# Patient Record
Sex: Male | Born: 1950 | Race: White | Hispanic: No | Marital: Married | Smoking: Former smoker
Health system: Southern US, Community
[De-identification: ages and names within clinical notes are randomized; demographics above are authoritative.]

## PROBLEM LIST (undated history)

## (undated) DIAGNOSIS — K219 Gastro-esophageal reflux disease without esophagitis: Secondary | ICD-10-CM

## (undated) DIAGNOSIS — I10 Essential (primary) hypertension: Secondary | ICD-10-CM

## (undated) DIAGNOSIS — R519 Headache, unspecified: Secondary | ICD-10-CM

## (undated) DIAGNOSIS — Z87442 Personal history of urinary calculi: Secondary | ICD-10-CM

## (undated) DIAGNOSIS — F32A Depression, unspecified: Secondary | ICD-10-CM

## (undated) DIAGNOSIS — E785 Hyperlipidemia, unspecified: Secondary | ICD-10-CM

## (undated) DIAGNOSIS — R51 Headache: Secondary | ICD-10-CM

## (undated) DIAGNOSIS — M1990 Unspecified osteoarthritis, unspecified site: Secondary | ICD-10-CM

## (undated) DIAGNOSIS — F329 Major depressive disorder, single episode, unspecified: Secondary | ICD-10-CM

## (undated) DIAGNOSIS — M5126 Other intervertebral disc displacement, lumbar region: Secondary | ICD-10-CM

## (undated) DIAGNOSIS — K589 Irritable bowel syndrome without diarrhea: Secondary | ICD-10-CM

## (undated) DIAGNOSIS — G4700 Insomnia, unspecified: Secondary | ICD-10-CM

## (undated) DIAGNOSIS — M5416 Radiculopathy, lumbar region: Secondary | ICD-10-CM

## (undated) HISTORY — DX: Hyperlipidemia, unspecified: E78.5

## (undated) HISTORY — PX: BACK SURGERY: SHX140

## (undated) HISTORY — DX: Other intervertebral disc displacement, lumbar region: M51.26

## (undated) HISTORY — PX: COLONOSCOPY: SHX174

## (undated) HISTORY — PX: HIP SURGERY: SHX245

## (undated) HISTORY — DX: Radiculopathy, lumbar region: M54.16

## (undated) HISTORY — DX: Irritable bowel syndrome, unspecified: K58.9

## (undated) HISTORY — DX: Insomnia, unspecified: G47.00

## (undated) HISTORY — PX: PENILE PROSTHESIS IMPLANT: SHX240

## (undated) HISTORY — PX: CERVICAL SPINE SURGERY: SHX589

---

## 2010-08-31 HISTORY — PX: UMBILICAL HERNIA REPAIR: SHX196

## 2015-09-16 DIAGNOSIS — M47892 Other spondylosis, cervical region: Secondary | ICD-10-CM | POA: Diagnosis not present

## 2015-09-16 DIAGNOSIS — M542 Cervicalgia: Secondary | ICD-10-CM | POA: Diagnosis not present

## 2015-09-16 DIAGNOSIS — M50323 Other cervical disc degeneration at C6-C7 level: Secondary | ICD-10-CM | POA: Diagnosis not present

## 2015-09-16 DIAGNOSIS — Z1389 Encounter for screening for other disorder: Secondary | ICD-10-CM | POA: Diagnosis not present

## 2015-09-16 DIAGNOSIS — M50322 Other cervical disc degeneration at C5-C6 level: Secondary | ICD-10-CM | POA: Diagnosis not present

## 2015-09-16 DIAGNOSIS — Z6827 Body mass index (BMI) 27.0-27.9, adult: Secondary | ICD-10-CM | POA: Diagnosis not present

## 2015-09-20 DIAGNOSIS — M4722 Other spondylosis with radiculopathy, cervical region: Secondary | ICD-10-CM | POA: Diagnosis not present

## 2015-09-20 DIAGNOSIS — R51 Headache: Secondary | ICD-10-CM | POA: Diagnosis not present

## 2015-09-20 DIAGNOSIS — Z981 Arthrodesis status: Secondary | ICD-10-CM | POA: Diagnosis not present

## 2015-09-20 DIAGNOSIS — M4802 Spinal stenosis, cervical region: Secondary | ICD-10-CM | POA: Diagnosis not present

## 2015-09-20 DIAGNOSIS — M5011 Cervical disc disorder with radiculopathy,  high cervical region: Secondary | ICD-10-CM | POA: Diagnosis not present

## 2015-09-20 DIAGNOSIS — M5013 Cervical disc disorder with radiculopathy, cervicothoracic region: Secondary | ICD-10-CM | POA: Diagnosis not present

## 2015-10-15 DIAGNOSIS — M542 Cervicalgia: Secondary | ICD-10-CM | POA: Diagnosis not present

## 2015-10-15 DIAGNOSIS — M5412 Radiculopathy, cervical region: Secondary | ICD-10-CM | POA: Diagnosis not present

## 2015-10-15 DIAGNOSIS — M5020 Other cervical disc displacement, unspecified cervical region: Secondary | ICD-10-CM | POA: Diagnosis not present

## 2015-10-15 DIAGNOSIS — M4722 Other spondylosis with radiculopathy, cervical region: Secondary | ICD-10-CM | POA: Diagnosis not present

## 2015-10-15 DIAGNOSIS — M5030 Other cervical disc degeneration, unspecified cervical region: Secondary | ICD-10-CM | POA: Diagnosis not present

## 2015-10-16 DIAGNOSIS — R1314 Dysphagia, pharyngoesophageal phase: Secondary | ICD-10-CM | POA: Diagnosis not present

## 2015-10-16 DIAGNOSIS — R490 Dysphonia: Secondary | ICD-10-CM | POA: Diagnosis not present

## 2015-10-30 DIAGNOSIS — M5021 Other cervical disc displacement,  high cervical region: Secondary | ICD-10-CM | POA: Diagnosis not present

## 2015-10-30 DIAGNOSIS — M5022 Other cervical disc displacement, mid-cervical region, unspecified level: Secondary | ICD-10-CM | POA: Diagnosis not present

## 2015-10-30 DIAGNOSIS — M4722 Other spondylosis with radiculopathy, cervical region: Secondary | ICD-10-CM | POA: Diagnosis not present

## 2015-10-30 DIAGNOSIS — M5031 Other cervical disc degeneration,  high cervical region: Secondary | ICD-10-CM | POA: Diagnosis not present

## 2015-10-30 DIAGNOSIS — M47812 Spondylosis without myelopathy or radiculopathy, cervical region: Secondary | ICD-10-CM | POA: Diagnosis not present

## 2015-10-30 DIAGNOSIS — M4802 Spinal stenosis, cervical region: Secondary | ICD-10-CM | POA: Diagnosis not present

## 2015-11-22 DIAGNOSIS — M5020 Other cervical disc displacement, unspecified cervical region: Secondary | ICD-10-CM | POA: Diagnosis not present

## 2016-01-23 DIAGNOSIS — Z981 Arthrodesis status: Secondary | ICD-10-CM | POA: Diagnosis not present

## 2016-03-25 DIAGNOSIS — M5416 Radiculopathy, lumbar region: Secondary | ICD-10-CM | POA: Diagnosis not present

## 2016-03-25 DIAGNOSIS — M5481 Occipital neuralgia: Secondary | ICD-10-CM | POA: Diagnosis not present

## 2016-04-03 DIAGNOSIS — M5416 Radiculopathy, lumbar region: Secondary | ICD-10-CM | POA: Diagnosis not present

## 2016-04-03 DIAGNOSIS — M545 Low back pain: Secondary | ICD-10-CM | POA: Diagnosis not present

## 2016-04-15 DIAGNOSIS — M542 Cervicalgia: Secondary | ICD-10-CM | POA: Diagnosis not present

## 2016-04-15 DIAGNOSIS — M5481 Occipital neuralgia: Secondary | ICD-10-CM | POA: Diagnosis not present

## 2016-04-15 DIAGNOSIS — Z6826 Body mass index (BMI) 26.0-26.9, adult: Secondary | ICD-10-CM | POA: Diagnosis not present

## 2016-04-15 DIAGNOSIS — R030 Elevated blood-pressure reading, without diagnosis of hypertension: Secondary | ICD-10-CM | POA: Diagnosis not present

## 2016-04-21 DIAGNOSIS — M542 Cervicalgia: Secondary | ICD-10-CM | POA: Diagnosis not present

## 2016-05-12 DIAGNOSIS — Z9181 History of falling: Secondary | ICD-10-CM | POA: Diagnosis not present

## 2016-05-12 DIAGNOSIS — M5481 Occipital neuralgia: Secondary | ICD-10-CM | POA: Diagnosis not present

## 2016-05-29 DIAGNOSIS — M4722 Other spondylosis with radiculopathy, cervical region: Secondary | ICD-10-CM | POA: Diagnosis not present

## 2016-05-29 DIAGNOSIS — Z6826 Body mass index (BMI) 26.0-26.9, adult: Secondary | ICD-10-CM | POA: Diagnosis not present

## 2016-05-29 DIAGNOSIS — Z981 Arthrodesis status: Secondary | ICD-10-CM | POA: Diagnosis not present

## 2016-05-29 DIAGNOSIS — M5030 Other cervical disc degeneration, unspecified cervical region: Secondary | ICD-10-CM | POA: Diagnosis not present

## 2016-06-01 DIAGNOSIS — M4722 Other spondylosis with radiculopathy, cervical region: Secondary | ICD-10-CM | POA: Diagnosis not present

## 2016-06-01 DIAGNOSIS — M5030 Other cervical disc degeneration, unspecified cervical region: Secondary | ICD-10-CM | POA: Diagnosis not present

## 2016-06-01 DIAGNOSIS — M5481 Occipital neuralgia: Secondary | ICD-10-CM | POA: Diagnosis not present

## 2016-06-19 DIAGNOSIS — Z125 Encounter for screening for malignant neoplasm of prostate: Secondary | ICD-10-CM | POA: Diagnosis not present

## 2016-06-19 DIAGNOSIS — Z23 Encounter for immunization: Secondary | ICD-10-CM | POA: Diagnosis not present

## 2016-06-19 DIAGNOSIS — Z0000 Encounter for general adult medical examination without abnormal findings: Secondary | ICD-10-CM | POA: Diagnosis not present

## 2016-08-04 DIAGNOSIS — H524 Presbyopia: Secondary | ICD-10-CM | POA: Diagnosis not present

## 2016-08-04 DIAGNOSIS — H2513 Age-related nuclear cataract, bilateral: Secondary | ICD-10-CM | POA: Diagnosis not present

## 2016-08-06 DIAGNOSIS — L821 Other seborrheic keratosis: Secondary | ICD-10-CM | POA: Diagnosis not present

## 2016-08-06 DIAGNOSIS — L570 Actinic keratosis: Secondary | ICD-10-CM | POA: Diagnosis not present

## 2016-08-06 DIAGNOSIS — L578 Other skin changes due to chronic exposure to nonionizing radiation: Secondary | ICD-10-CM | POA: Diagnosis not present

## 2016-08-13 DIAGNOSIS — M5416 Radiculopathy, lumbar region: Secondary | ICD-10-CM | POA: Diagnosis not present

## 2016-09-09 DIAGNOSIS — G501 Atypical facial pain: Secondary | ICD-10-CM | POA: Diagnosis not present

## 2016-09-09 DIAGNOSIS — M5481 Occipital neuralgia: Secondary | ICD-10-CM | POA: Diagnosis not present

## 2016-09-22 DIAGNOSIS — G501 Atypical facial pain: Secondary | ICD-10-CM | POA: Diagnosis not present

## 2016-09-24 DIAGNOSIS — G501 Atypical facial pain: Secondary | ICD-10-CM | POA: Diagnosis not present

## 2016-09-24 DIAGNOSIS — R51 Headache: Secondary | ICD-10-CM | POA: Diagnosis not present

## 2016-10-15 DIAGNOSIS — E663 Overweight: Secondary | ICD-10-CM | POA: Diagnosis not present

## 2016-10-15 DIAGNOSIS — Z6827 Body mass index (BMI) 27.0-27.9, adult: Secondary | ICD-10-CM | POA: Diagnosis not present

## 2016-11-18 DIAGNOSIS — L820 Inflamed seborrheic keratosis: Secondary | ICD-10-CM | POA: Diagnosis not present

## 2016-11-18 DIAGNOSIS — L720 Epidermal cyst: Secondary | ICD-10-CM | POA: Diagnosis not present

## 2016-11-18 DIAGNOSIS — L578 Other skin changes due to chronic exposure to nonionizing radiation: Secondary | ICD-10-CM | POA: Diagnosis not present

## 2016-12-08 DIAGNOSIS — G501 Atypical facial pain: Secondary | ICD-10-CM | POA: Diagnosis not present

## 2016-12-08 DIAGNOSIS — M5481 Occipital neuralgia: Secondary | ICD-10-CM | POA: Diagnosis not present

## 2016-12-08 DIAGNOSIS — M961 Postlaminectomy syndrome, not elsewhere classified: Secondary | ICD-10-CM | POA: Diagnosis not present

## 2016-12-16 DIAGNOSIS — M21372 Foot drop, left foot: Secondary | ICD-10-CM | POA: Diagnosis not present

## 2017-01-05 DIAGNOSIS — I10 Essential (primary) hypertension: Secondary | ICD-10-CM | POA: Diagnosis not present

## 2017-01-05 DIAGNOSIS — M961 Postlaminectomy syndrome, not elsewhere classified: Secondary | ICD-10-CM | POA: Diagnosis not present

## 2017-01-05 DIAGNOSIS — G501 Atypical facial pain: Secondary | ICD-10-CM | POA: Diagnosis not present

## 2017-01-05 DIAGNOSIS — M5481 Occipital neuralgia: Secondary | ICD-10-CM | POA: Diagnosis not present

## 2017-04-05 DIAGNOSIS — N132 Hydronephrosis with renal and ureteral calculous obstruction: Secondary | ICD-10-CM | POA: Diagnosis not present

## 2017-04-05 DIAGNOSIS — R109 Unspecified abdominal pain: Secondary | ICD-10-CM | POA: Diagnosis not present

## 2017-04-05 DIAGNOSIS — Z6827 Body mass index (BMI) 27.0-27.9, adult: Secondary | ICD-10-CM | POA: Diagnosis not present

## 2017-04-05 DIAGNOSIS — Z139 Encounter for screening, unspecified: Secondary | ICD-10-CM | POA: Diagnosis not present

## 2017-05-04 DIAGNOSIS — F4542 Pain disorder with related psychological factors: Secondary | ICD-10-CM | POA: Diagnosis not present

## 2017-05-04 DIAGNOSIS — M961 Postlaminectomy syndrome, not elsewhere classified: Secondary | ICD-10-CM | POA: Diagnosis not present

## 2017-05-13 DIAGNOSIS — R918 Other nonspecific abnormal finding of lung field: Secondary | ICD-10-CM | POA: Diagnosis not present

## 2017-05-13 DIAGNOSIS — R05 Cough: Secondary | ICD-10-CM | POA: Diagnosis not present

## 2017-05-13 DIAGNOSIS — R079 Chest pain, unspecified: Secondary | ICD-10-CM | POA: Diagnosis not present

## 2017-05-13 DIAGNOSIS — M25511 Pain in right shoulder: Secondary | ICD-10-CM | POA: Diagnosis not present

## 2017-06-01 DIAGNOSIS — M1711 Unilateral primary osteoarthritis, right knee: Secondary | ICD-10-CM | POA: Diagnosis not present

## 2017-06-08 DIAGNOSIS — K219 Gastro-esophageal reflux disease without esophagitis: Secondary | ICD-10-CM | POA: Diagnosis not present

## 2017-06-08 DIAGNOSIS — K5900 Constipation, unspecified: Secondary | ICD-10-CM | POA: Diagnosis not present

## 2017-06-08 DIAGNOSIS — K2270 Barrett's esophagus without dysplasia: Secondary | ICD-10-CM | POA: Diagnosis not present

## 2017-06-16 DIAGNOSIS — K219 Gastro-esophageal reflux disease without esophagitis: Secondary | ICD-10-CM | POA: Diagnosis not present

## 2017-06-16 DIAGNOSIS — D175 Benign lipomatous neoplasm of intra-abdominal organs: Secondary | ICD-10-CM | POA: Diagnosis not present

## 2017-06-16 DIAGNOSIS — K2270 Barrett's esophagus without dysplasia: Secondary | ICD-10-CM | POA: Diagnosis not present

## 2017-06-16 DIAGNOSIS — D125 Benign neoplasm of sigmoid colon: Secondary | ICD-10-CM | POA: Diagnosis not present

## 2017-07-05 DIAGNOSIS — G894 Chronic pain syndrome: Secondary | ICD-10-CM | POA: Diagnosis not present

## 2017-07-05 DIAGNOSIS — M961 Postlaminectomy syndrome, not elsewhere classified: Secondary | ICD-10-CM | POA: Diagnosis not present

## 2017-07-12 DIAGNOSIS — M961 Postlaminectomy syndrome, not elsewhere classified: Secondary | ICD-10-CM | POA: Diagnosis not present

## 2017-07-12 DIAGNOSIS — G894 Chronic pain syndrome: Secondary | ICD-10-CM | POA: Diagnosis not present

## 2017-07-12 DIAGNOSIS — R030 Elevated blood-pressure reading, without diagnosis of hypertension: Secondary | ICD-10-CM | POA: Diagnosis not present

## 2017-07-12 DIAGNOSIS — Z6827 Body mass index (BMI) 27.0-27.9, adult: Secondary | ICD-10-CM | POA: Diagnosis not present

## 2017-07-13 ENCOUNTER — Other Ambulatory Visit: Payer: Self-pay | Admitting: Anesthesiology

## 2017-07-27 NOTE — Pre-Procedure Instructions (Signed)
Parker Lawrence  07/27/2017      [place], [place] [address] Phone: [phone] Fax: [phone]    Your procedure is scheduled on Friday November 30.  Report to [facility] Admitting at 5:30 A.M.  Call this number if you have problems the morning of surgery:  [phone]   Remember:  Do not eat food or drink liquids after midnight.  Take these medicines the morning of surgery with A SIP OF WATER:   Acetaminophen (tylenol) if needed dexlansoprazole (Dexilant) Gabapentin (neurontin) Venlafaxine (effexor)  7 days prior to surgery STOP taking any Aleve, Naproxen, Ibuprofen, Motrin, Advil, Goody's, BC's, all herbal medications, fish oil, and all vitamins  Follow MD's instructions on stopping Aspirin. If no instructions have been given please call your surgeon for instruction.     Do not wear jewelry, make-up or nail polish.  Do not wear lotions, powders, or perfumes, or deoderant.  Do not shave 48 hours prior to surgery.  Men may shave face and neck.  Do not bring valuables to the hospital.  [facility] is not responsible for any belongings or valuables.  Contacts, dentures or bridgework may not be worn into surgery.  Leave your suitcase in the car.  After surgery it may be brought to your room.  For patients admitted to the hospital, discharge time will be determined by your treatment team.  Patients discharged the day of surgery will not be allowed to drive home.   Special instructions:    [facility]- Preparing For Surgery  Before surgery, you can play an important role. Because skin is not sterile, your skin needs to be as free of germs as possible. You can reduce the number of germs on your skin by washing with CHG (chlorahexidine gluconate) Soap before surgery.  CHG is an antiseptic cleaner which kills germs and bonds with the skin to continue killing germs even after washing.  Please do not  use if you have an allergy to CHG or antibacterial soaps. If your skin becomes reddened/irritated stop using the CHG.  Do not shave (including legs and underarms) for at least 48 hours prior to first CHG shower. It is OK to shave your face.  Please follow these instructions carefully.   1. Shower the NIGHT BEFORE SURGERY and the MORNING OF SURGERY with CHG.   2. If you chose to wash your hair, wash your hair first as usual with your normal shampoo.  3. After you shampoo, rinse your hair and body thoroughly to remove the shampoo.  4. Use CHG as you would any other liquid soap. You can apply CHG directly to the skin and wash gently with a scrungie or a clean washcloth.   5. Apply the CHG Soap to your body ONLY FROM THE NECK DOWN.  Do not use on open wounds or open sores. Avoid contact with your eyes, ears, mouth and genitals (private parts). Wash Face and genitals (private parts)  with your normal soap.  6. Wash thoroughly, paying special attention to the area where your surgery will be performed.  7. Thoroughly rinse your body with warm water from the neck down.  8. DO NOT shower/wash with your normal soap after using and rinsing off the CHG Soap.  9. Pat yourself dry with a CLEAN TOWEL.  10. Wear CLEAN PAJAMAS to bed the night before surgery, wear comfortable clothes the morning of surgery  11. Place CLEAN  SHEETS on your bed the night of your first shower and DO NOT SLEEP WITH PETS.    Day of Surgery: Do not apply any deodorants/lotions. Please wear clean clothes to the hospital/surgery center.      Please read over the following fact sheets that you were given. Coughing and Deep Breathing and Surgical Site Infection Prevention

## 2017-07-28 ENCOUNTER — Encounter (HOSPITAL_COMMUNITY): Payer: Self-pay

## 2017-07-28 ENCOUNTER — Encounter (HOSPITAL_COMMUNITY)
Admission: RE | Admit: 2017-07-28 | Discharge: 2017-07-28 | Disposition: A | Discharge status: Home / Self Care | Payer: PPO | Source: Ambulatory Visit | Attending: Anesthesiology | Admitting: Anesthesiology

## 2017-07-28 ENCOUNTER — Other Ambulatory Visit: Payer: Self-pay

## 2017-07-28 DIAGNOSIS — M5416 Radiculopathy, lumbar region: Secondary | ICD-10-CM | POA: Diagnosis not present

## 2017-07-28 DIAGNOSIS — M545 Low back pain: Secondary | ICD-10-CM | POA: Diagnosis not present

## 2017-07-28 DIAGNOSIS — M961 Postlaminectomy syndrome, not elsewhere classified: Secondary | ICD-10-CM | POA: Diagnosis not present

## 2017-07-28 DIAGNOSIS — K219 Gastro-esophageal reflux disease without esophagitis: Secondary | ICD-10-CM | POA: Diagnosis not present

## 2017-07-28 DIAGNOSIS — Z7982 Long term (current) use of aspirin: Secondary | ICD-10-CM | POA: Diagnosis not present

## 2017-07-28 DIAGNOSIS — G8929 Other chronic pain: Secondary | ICD-10-CM | POA: Diagnosis not present

## 2017-07-28 DIAGNOSIS — Y838 Other surgical procedures as the cause of abnormal reaction of the patient, or of later complication, without mention of misadventure at the time of the procedure: Secondary | ICD-10-CM | POA: Diagnosis not present

## 2017-07-28 DIAGNOSIS — F329 Major depressive disorder, single episode, unspecified: Secondary | ICD-10-CM | POA: Diagnosis not present

## 2017-07-28 DIAGNOSIS — Z79899 Other long term (current) drug therapy: Secondary | ICD-10-CM | POA: Diagnosis not present

## 2017-07-28 HISTORY — DX: Headache, unspecified: R51.9

## 2017-07-28 HISTORY — DX: Unspecified osteoarthritis, unspecified site: M19.90

## 2017-07-28 HISTORY — DX: Major depressive disorder, single episode, unspecified: F32.9

## 2017-07-28 HISTORY — DX: Headache: R51

## 2017-07-28 HISTORY — DX: Gastro-esophageal reflux disease without esophagitis: K21.9

## 2017-07-28 HISTORY — DX: Depression, unspecified: F32.A

## 2017-07-28 LAB — BASIC METABOLIC PANEL
Anion gap: 6 (ref 5–15)
BUN: 7 mg/dL (ref 6–20)
CHLORIDE: 107 mmol/L (ref 101–111)
CO2: 26 mmol/L (ref 22–32)
CREATININE: 0.93 mg/dL (ref 0.61–1.24)
Calcium: 9.1 mg/dL (ref 8.9–10.3)
GFR calc Af Amer: >60 mL/min (ref >60)
GFR calc non Af Amer: >60 mL/min (ref >60)
Glucose, Bld: 119 mg/dL — ABNORMAL HIGH (ref 65–99)
POTASSIUM: 4.2 mmol/L (ref 3.5–5.1)
Sodium: 139 mmol/L (ref 135–145)

## 2017-07-28 LAB — CBC
HEMATOCRIT: 44.5 % (ref 39.0–52.0)
HEMOGLOBIN: 15.1 g/dL (ref 13.0–17.0)
MCH: 30 pg (ref 26.0–34.0)
MCHC: 33.9 g/dL (ref 30.0–36.0)
MCV: 88.3 fL (ref 78.0–100.0)
Platelets: 236 10*3/uL (ref 150–400)
RBC: 5.04 MIL/uL (ref 4.22–5.81)
RDW: 13 % (ref 11.5–15.5)
WBC: 5.2 10*3/uL (ref 4.0–10.5)

## 2017-07-28 LAB — PROTIME-INR
INR: 0.94
Prothrombin Time: 12.5 seconds (ref 11.4–15.2)

## 2017-07-28 LAB — SURGICAL PCR SCREEN
MRSA, PCR: NEGATIVE
Staphylococcus aureus: NEGATIVE

## 2017-07-28 LAB — APTT: aPTT: 31 seconds (ref 24–36)

## 2017-07-28 MED ORDER — CHLORHEXIDINE GLUCONATE CLOTH 2 % EX PADS: 6.0000 | MEDICATED_PAD | Freq: Once | CUTANEOUS | Status: DC

## 2017-07-28 NOTE — Progress Notes (Signed)
PCP - Amy Moon  Pt was instructed by surgeon to stop taking aspirin and fish oil 7 days prior to surgery. Pt is no longer taking these.   Pt denies having cardiologist or cardiac history. No cardiac workup.   Patient denies shortness of breath, fever, cough and chest pain at PAT appointment  Patient verbalized understanding of instructions that were given to them at the PAT appointment. Patient was also instructed that they will need to review over the PAT instructions again at home before surgery.
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
## 2017-07-29 NOTE — Anesthesia Preprocedure Evaluation (Addendum)
Anesthesia Evaluation  Patient identified by MRN, date of birth, ID band Patient awake    Reviewed: Allergy & Precautions, NPO status , Patient's Chart, lab work & pertinent test results, reviewed documented beta blocker date and time   History of Anesthesia Complications Negative for: history of anesthetic complications  Airway Mallampati: I  TM Distance: >3 FB Neck ROM: Full    Dental  (+) Dental Advisory Given, Edentulous Upper, Edentulous Lower   Pulmonary neg pulmonary ROS,    Pulmonary exam normal        Cardiovascular negative cardio ROS Normal cardiovascular exam     Neuro/Psych PSYCHIATRIC DISORDERS Depression    GI/Hepatic Neg liver ROS, GERD  ,  Endo/Other  negative endocrine ROS  Renal/GU negative Renal ROS     Musculoskeletal negative musculoskeletal ROS (+)   Abdominal   Peds  Hematology negative hematology ROS (+)   Anesthesia Other Findings Day of surgery medications reviewed with the patient.  Reproductive/Obstetrics                           Anesthesia Physical Anesthesia Plan  ASA: II  Anesthesia Plan: MAC   Post-op Pain Management:    Induction:   PONV Risk Score and Plan: 2 and Ondansetron and Dexamethasone  Airway Management Planned: Natural Airway and Simple Face Mask  Additional Equipment:   Intra-op Plan:   Post-operative Plan:   Informed Consent: I have reviewed the patients History and Physical, chart, labs and discussed the procedure including the risks, benefits and alternatives for the proposed anesthesia with the patient or authorized representative who has indicated his/her understanding and acceptance.   Dental advisory given  Plan Discussed with: CRNA, Anesthesiologist and Surgeon  Anesthesia Plan Comments:        Anesthesia Quick Evaluation

## 2017-07-30 ENCOUNTER — Ambulatory Visit (HOSPITAL_COMMUNITY): Payer: PPO

## 2017-07-30 ENCOUNTER — Ambulatory Visit (HOSPITAL_COMMUNITY): Payer: PPO | Admitting: Certified Registered"

## 2017-07-30 ENCOUNTER — Ambulatory Visit (HOSPITAL_COMMUNITY)
Admission: RE | Admit: 2017-07-30 | Discharge: 2017-07-30 | Disposition: A | Discharge status: Home / Self Care | Payer: PPO | Source: Ambulatory Visit | Attending: Anesthesiology | Admitting: Anesthesiology

## 2017-07-30 ENCOUNTER — Encounter (HOSPITAL_COMMUNITY): Payer: Self-pay | Admitting: Certified Registered"

## 2017-07-30 ENCOUNTER — Encounter (HOSPITAL_COMMUNITY)
Admission: RE | Disposition: A | Discharge status: Home / Self Care | Payer: Self-pay | Source: Ambulatory Visit | Attending: Anesthesiology

## 2017-07-30 DIAGNOSIS — M5116 Intervertebral disc disorders with radiculopathy, lumbar region: Secondary | ICD-10-CM | POA: Diagnosis not present

## 2017-07-30 DIAGNOSIS — M961 Postlaminectomy syndrome, not elsewhere classified: Secondary | ICD-10-CM | POA: Diagnosis not present

## 2017-07-30 DIAGNOSIS — M545 Low back pain: Secondary | ICD-10-CM | POA: Insufficient documentation

## 2017-07-30 DIAGNOSIS — F329 Major depressive disorder, single episode, unspecified: Secondary | ICD-10-CM | POA: Insufficient documentation

## 2017-07-30 DIAGNOSIS — Z7982 Long term (current) use of aspirin: Secondary | ICD-10-CM | POA: Diagnosis not present

## 2017-07-30 DIAGNOSIS — Z79899 Other long term (current) drug therapy: Secondary | ICD-10-CM | POA: Diagnosis not present

## 2017-07-30 DIAGNOSIS — Z462 Encounter for fitting and adjustment of other devices related to nervous system and special senses: Secondary | ICD-10-CM | POA: Diagnosis not present

## 2017-07-30 DIAGNOSIS — M5416 Radiculopathy, lumbar region: Secondary | ICD-10-CM | POA: Insufficient documentation

## 2017-07-30 DIAGNOSIS — K219 Gastro-esophageal reflux disease without esophagitis: Secondary | ICD-10-CM | POA: Insufficient documentation

## 2017-07-30 DIAGNOSIS — Y838 Other surgical procedures as the cause of abnormal reaction of the patient, or of later complication, without mention of misadventure at the time of the procedure: Secondary | ICD-10-CM | POA: Insufficient documentation

## 2017-07-30 DIAGNOSIS — G894 Chronic pain syndrome: Secondary | ICD-10-CM | POA: Diagnosis not present

## 2017-07-30 DIAGNOSIS — G8929 Other chronic pain: Secondary | ICD-10-CM | POA: Diagnosis not present

## 2017-07-30 HISTORY — PX: SPINAL CORD STIMULATOR INSERTION: SHX5378

## 2017-07-30 SURGERY — INSERTION, SPINAL CORD STIMULATOR, LUMBAR
Anesthesia: Monitor Anesthesia Care

## 2017-07-30 MED ORDER — HYDROCODONE-ACETAMINOPHEN 10-325 MG PO TABS: 1.0000 | ORAL_TABLET | Freq: Four times a day (QID) | ORAL | 0 refills | Status: DC | PRN

## 2017-07-30 MED ORDER — LIDOCAINE 2% (20 MG/ML) 5 ML SYRINGE
INTRAMUSCULAR | Status: DC | PRN
  Administered 2017-07-30: 50 mg via INTRAVENOUS

## 2017-07-30 MED ORDER — BUPIVACAINE-EPINEPHRINE (PF) 0.5% -1:200000 IJ SOLN
INTRAMUSCULAR | Status: DC | PRN
  Administered 2017-07-30: 18 mL

## 2017-07-30 MED ORDER — HYDROMORPHONE HCL 1 MG/ML IJ SOLN
INTRAMUSCULAR | Status: AC
  Filled 2017-07-30: qty 1

## 2017-07-30 MED ORDER — HYDROMORPHONE HCL 1 MG/ML IJ SOLN
INTRAMUSCULAR | Status: AC
  Administered 2017-07-30: 0.5 mg via INTRAVENOUS
  Filled 2017-07-30: qty 1

## 2017-07-30 MED ORDER — ONDANSETRON HCL 4 MG/2ML IJ SOLN
INTRAMUSCULAR | Status: DC | PRN
  Administered 2017-07-30: 4 mg via INTRAVENOUS

## 2017-07-30 MED ORDER — ONDANSETRON HCL 4 MG/2ML IJ SOLN
INTRAMUSCULAR | Status: AC
  Filled 2017-07-30: qty 2

## 2017-07-30 MED ORDER — FENTANYL CITRATE (PF) 100 MCG/2ML IJ SOLN
INTRAMUSCULAR | Status: DC | PRN
  Administered 2017-07-30 (×2): 50 ug via INTRAVENOUS

## 2017-07-30 MED ORDER — DOUBLE ANTIBIOTIC 500-10000 UNIT/GM EX OINT
TOPICAL_OINTMENT | CUTANEOUS | Status: AC
  Filled 2017-07-30: qty 1

## 2017-07-30 MED ORDER — LACTATED RINGERS IV SOLN
INTRAVENOUS | Status: DC | PRN
  Administered 2017-07-30: 07:00:00 via INTRAVENOUS

## 2017-07-30 MED ORDER — 0.9 % SODIUM CHLORIDE (POUR BTL) OPTIME
TOPICAL | Status: DC | PRN
  Administered 2017-07-30: 1000 mL

## 2017-07-30 MED ORDER — HYDROMORPHONE HCL 1 MG/ML IJ SOLN
0.2500 mg | INTRAMUSCULAR | Status: DC | PRN
  Administered 2017-07-30 (×4): 0.5 mg via INTRAVENOUS

## 2017-07-30 MED ORDER — PROPOFOL 500 MG/50ML IV EMUL
INTRAVENOUS | Status: DC | PRN
  Administered 2017-07-30: 60 ug/kg/min via INTRAVENOUS

## 2017-07-30 MED ORDER — MIDAZOLAM HCL 2 MG/2ML IJ SOLN
INTRAMUSCULAR | Status: AC
  Filled 2017-07-30: qty 2

## 2017-07-30 MED ORDER — BUPIVACAINE HCL (PF) 0.5 % IJ SOLN
INTRAMUSCULAR | Status: AC
  Filled 2017-07-30: qty 30

## 2017-07-30 MED ORDER — BUPIVACAINE-EPINEPHRINE (PF) 0.5% -1:200000 IJ SOLN
INTRAMUSCULAR | Status: AC
  Filled 2017-07-30: qty 30

## 2017-07-30 MED ORDER — MIDAZOLAM HCL 5 MG/5ML IJ SOLN
INTRAMUSCULAR | Status: DC | PRN
  Administered 2017-07-30: 2 mg via INTRAVENOUS

## 2017-07-30 MED ORDER — CEFAZOLIN SODIUM-DEXTROSE 2-4 GM/100ML-% IV SOLN
2.0000 g | INTRAVENOUS | Status: AC
  Administered 2017-07-30: 2 g via INTRAVENOUS
  Filled 2017-07-30: qty 100

## 2017-07-30 MED ORDER — PROPOFOL 10 MG/ML IV BOLUS
INTRAVENOUS | Status: DC | PRN
  Administered 2017-07-30: 30 mg via INTRAVENOUS
  Administered 2017-07-30 (×2): 20 mg via INTRAVENOUS

## 2017-07-30 MED ORDER — DEXAMETHASONE SODIUM PHOSPHATE 10 MG/ML IJ SOLN
INTRAMUSCULAR | Status: AC
  Filled 2017-07-30: qty 1

## 2017-07-30 MED ORDER — DEXAMETHASONE SODIUM PHOSPHATE 4 MG/ML IJ SOLN
INTRAMUSCULAR | Status: DC | PRN
  Administered 2017-07-30: 8 mg via INTRAVENOUS

## 2017-07-30 MED ORDER — LIDOCAINE 2% (20 MG/ML) 5 ML SYRINGE
INTRAMUSCULAR | Status: AC
  Filled 2017-07-30: qty 5

## 2017-07-30 MED ORDER — PROMETHAZINE HCL 25 MG/ML IJ SOLN: 6.2500 mg | INTRAMUSCULAR | Status: DC | PRN

## 2017-07-30 MED ORDER — FENTANYL CITRATE (PF) 250 MCG/5ML IJ SOLN
INTRAMUSCULAR | Status: AC
  Filled 2017-07-30: qty 5

## 2017-07-30 MED ORDER — CLINDAMYCIN HCL 150 MG PO CAPS: 150.0000 mg | ORAL_CAPSULE | Freq: Three times a day (TID) | ORAL | 0 refills | Status: AC

## 2017-07-30 MED ORDER — SODIUM CHLORIDE 0.9 % IR SOLN
Status: DC | PRN
  Administered 2017-07-30: 08:00:00

## 2017-07-30 SURGICAL SUPPLY — 67 items
ADH SKN CLS APL DERMABOND .7 (GAUZE/BANDAGES/DRESSINGS) ×1
ANCH LD 4 SETX2 CLIK X (Stimulator) ×1 IMPLANT
ANCHOR CLIK X NEURO (Stimulator) ×1 IMPLANT
APL SKNCLS STERI-STRIP NONHPOA (GAUZE/BANDAGES/DRESSINGS)
BAG DECANTER FOR FLEXI CONT (MISCELLANEOUS) ×2 IMPLANT
BENZOIN TINCTURE PRP APPL 2/3 (GAUZE/BANDAGES/DRESSINGS) IMPLANT
BINDER ABDOMINAL 12 ML 46-62 (SOFTGOODS) ×2 IMPLANT
BLADE CLIPPER SURG (BLADE) IMPLANT
CABLE OR STIMULATOR 2X8 61 (WIRE) ×2 IMPLANT
CHLORAPREP W/TINT 26ML (MISCELLANEOUS) ×2 IMPLANT
CLIP VESOCCLUDE SM WIDE 6/CT (CLIP) IMPLANT
DERMABOND ADVANCED (GAUZE/BANDAGES/DRESSINGS) ×1
DERMABOND ADVANCED .7 DNX12 (GAUZE/BANDAGES/DRESSINGS) ×1 IMPLANT
DRAPE C-ARM 42X72 X-RAY (DRAPES) ×2 IMPLANT
DRAPE C-ARMOR (DRAPES) ×2 IMPLANT
DRAPE LAPAROTOMY 100X72X124 (DRAPES) ×2 IMPLANT
DRAPE POUCH INSTRU U-SHP 10X18 (DRAPES) ×2 IMPLANT
DRAPE SURG 17X23 STRL (DRAPES) ×2 IMPLANT
DRSG OPSITE POSTOP 3X4 (GAUZE/BANDAGES/DRESSINGS) ×1 IMPLANT
DRSG OPSITE POSTOP 4X6 (GAUZE/BANDAGES/DRESSINGS) ×1 IMPLANT
ELECT REM PT RETURN 9FT ADLT (ELECTROSURGICAL) ×2
ELECTRODE REM PT RTRN 9FT ADLT (ELECTROSURGICAL) ×1 IMPLANT
GAUZE SPONGE 4X4 16PLY XRAY LF (GAUZE/BANDAGES/DRESSINGS) ×3 IMPLANT
GENERATOR NEUROSTIMULATOR (Neurostimulator) ×1 IMPLANT
GLOVE BIOGEL PI IND STRL 7.5 (GLOVE) ×1 IMPLANT
GLOVE BIOGEL PI INDICATOR 7.5 (GLOVE) ×1
GLOVE ECLIPSE 7.5 STRL STRAW (GLOVE) ×2 IMPLANT
GLOVE EXAM NITRILE LRG STRL (GLOVE) IMPLANT
GLOVE EXAM NITRILE XL STR (GLOVE) IMPLANT
GLOVE EXAM NITRILE XS STR PU (GLOVE) IMPLANT
GOWN STRL REUS W/ TWL LRG LVL3 (GOWN DISPOSABLE) IMPLANT
GOWN STRL REUS W/ TWL XL LVL3 (GOWN DISPOSABLE) IMPLANT
GOWN STRL REUS W/TWL 2XL LVL3 (GOWN DISPOSABLE) IMPLANT
GOWN STRL REUS W/TWL LRG LVL3 (GOWN DISPOSABLE)
GOWN STRL REUS W/TWL XL LVL3 (GOWN DISPOSABLE)
KIT BASIN OR (CUSTOM PROCEDURE TRAY) ×2 IMPLANT
KIT CHARGING (KITS) ×1
KIT CHARGING PRECISION NEURO (KITS) IMPLANT
KIT REMOTE CONTROL 112802 FREE (KITS) ×1 IMPLANT
KIT ROOM TURNOVER OR (KITS) ×2 IMPLANT
LEAD INFINION CX PERC 70CM (Lead) ×2 IMPLANT
NDL 18GX1X1/2 (RX/OR ONLY) (NEEDLE) IMPLANT
NDL ENTRADA 4.5IN (NEEDLE) IMPLANT
NDL HYPO 25X1 1.5 SAFETY (NEEDLE) ×1 IMPLANT
NEEDLE 18GX1X1/2 (RX/OR ONLY) (NEEDLE) IMPLANT
NEEDLE ENTRADA 4.5IN (NEEDLE) ×4 IMPLANT
NEEDLE HYPO 25X1 1.5 SAFETY (NEEDLE) ×2 IMPLANT
NS IRRIG 1000ML POUR BTL (IV SOLUTION) ×2 IMPLANT
PACK LAMINECTOMY NEURO (CUSTOM PROCEDURE TRAY) ×2 IMPLANT
PAD ARMBOARD 7.5X6 YLW CONV (MISCELLANEOUS) ×2 IMPLANT
SPONGE LAP 4X18 X RAY DECT (DISPOSABLE) ×2 IMPLANT
SPONGE SURGIFOAM ABS GEL SZ50 (HEMOSTASIS) IMPLANT
STAPLER SKIN PROX WIDE 3.9 (STAPLE) ×2 IMPLANT
STRIP CLOSURE SKIN 1/2X4 (GAUZE/BANDAGES/DRESSINGS) IMPLANT
SUT MNCRL AB 4-0 PS2 18 (SUTURE) IMPLANT
SUT SILK 0 (SUTURE) ×2
SUT SILK 0 MO-6 18XCR BRD 8 (SUTURE) ×1 IMPLANT
SUT SILK 0 TIES 10X30 (SUTURE) IMPLANT
SUT SILK 2 0 TIES 10X30 (SUTURE) IMPLANT
SUT VIC AB 2-0 CP2 18 (SUTURE) ×4 IMPLANT
SYR 10ML LL (SYRINGE) IMPLANT
SYR EPIDURAL 5ML GLASS (SYRINGE) ×2 IMPLANT
TOOL LONG TUNNEL (SPINAL CORD STIMULATOR) ×1 IMPLANT
TOWEL GREEN STERILE (TOWEL DISPOSABLE) ×2 IMPLANT
TOWEL GREEN STERILE FF (TOWEL DISPOSABLE) ×2 IMPLANT
WATER STERILE IRR 1000ML POUR (IV SOLUTION) ×2 IMPLANT
YANKAUER SUCT BULB TIP NO VENT (SUCTIONS) ×2 IMPLANT

## 2017-07-30 NOTE — Anesthesia Postprocedure Evaluation (Signed)
Anesthesia Post Note  Patient: Parker Lawrence  Procedure(s) Performed: LUMBAR SPINAL CORD STIMULATOR INSERTION (N/A )     Patient location during evaluation: PACU Anesthesia Type: MAC Level of consciousness: awake and alert Pain management: pain level controlled Vital Signs Assessment: post-procedure vital signs reviewed and stable Respiratory status: spontaneous breathing and respiratory function stable Cardiovascular status: stable Postop Assessment: no apparent nausea or vomiting Anesthetic complications: no    Last Vitals:  Vitals:   07/30/17 1015 07/30/17 1030  BP: 132/86 127/71  Pulse: 62 76  Resp: 13 16  Temp:  36.4 C  SpO2: 93% 93%    Last Pain:  Vitals:   07/30/17 1000  TempSrc:   PainSc: 3                  [place] DANIEL

## 2017-07-30 NOTE — Discharge Instructions (Addendum)

## 2017-07-30 NOTE — Transfer of Care (Signed)
Immediate Anesthesia Transfer of Care Note  Patient: Parker Lawrence  Procedure(s) Performed: LUMBAR SPINAL CORD STIMULATOR INSERTION (N/A )  Patient Location: PACU  Anesthesia Type:MAC  Level of Consciousness: awake, alert  and oriented  Airway & Oxygen Therapy: Patient Spontanous Breathing and Patient connected to nasal cannula oxygen  Post-op Assessment: Report given to RN and Post -op Vital signs reviewed and stable  Post vital signs: Reviewed and stable  Last Vitals:  Vitals:   07/30/17 0616  BP: (!) 162/80  Pulse: 65  Resp: 18  Temp: 36.6 C  SpO2: 96%    Last Pain:  Vitals:   07/30/17 0616  TempSrc: Oral         Complications: No apparent anesthesia complications

## 2017-07-30 NOTE — Anesthesia Procedure Notes (Signed)
Procedure Name: MAC Date/Time: 07/30/2017 7:33 AM Performed by: Orlie Dakin, CRNA Pre-anesthesia Checklist: Patient identified, Emergency Drugs available, Suction available, Patient being monitored and Timeout performed Patient Re-evaluated:Patient Re-evaluated prior to induction Oxygen Delivery Method: Nasal cannula

## 2017-07-30 NOTE — Op Note (Signed)
PREOP DX: 1) lumbago  2) lumbar radiculopathy  3) lumbar post-laminectomy syndrome  4) chronic pain  POSTOP DX: 1) lumbago  2) lumbar radiculopathy  3) lumbar post-laminectomy syndrome  4) chronic pain PROCEDURES PERFORMED:1) intraop fluoro 2) placement of 2 16 contact [employer] Infinion leads 3) placement of Spectra SCS generator  SURGEON:Arijana Narayan  ASSISTANT: NONE  ANESTHESIA:MAC/TIVA EBL: <20cc  DESCRIPTION OF PROCEDURE: After a discussion of risks, benefits and alternatives, informed consent was obtained. The patient was taken to the OR,general anesthesia induced by the anesthesia team,turned prone onto a [place] table, all pressure points padded, SCD's placed, and an adequate plane of anesthesia induced. A timeout was taken to verify the correct patient, position, personnel, availability of appropriate equipment, and administration of perioperative antibiotics.   The thoracic and lumbar areas were widely prepped with chloraprep and draped into a sterile field. Fluoroscopy was used to plan arightparamedian incision at theL1-L2 levels, and an incision made with a 10 blade and carried down to the dorsolumbar fascia with the bovie and blunt dissection. Retractors were placed and a 14g [employer] tuohy needle placed into the epidural space at the T12-L1 interspace using biplanar fluoro and loss-of-resistance technique. The needle was aspirated without any return of fluid. A [employer] INFINION lead was introduced and under live AP fluoro advanced until the2distal-most contactsoverlay the inferior aspect T7vertebral body shadow with the rest of the contacts distributed over the T8and T9 vertebral bodies in a position just at anatomic midline. A second Infinion lead was placed left of anatomic midline in the same levels using the same  technique. The patient was awakened, and coverage tested. Patient reported good coverage in all of his pain areas.   Adequate sedation was re-established, and 0 silk sutures were placed in the fascia adjacent to the needles. The needles and stylets were removed under fluoroscopy with no lead migration noted. Leads were then fixed to the fascia byClik anchorswith the sutures; repeat images were obtained to verify that there had been no lead migration.  The incision was inspected and hemostasis obtained with the bipolar cautery.  Attention was then turned to creation of a subcutaneous pocket. At therightflank, a 3 cm incision was made with a 10 blade and using the bovie and blunt dissection a pocket of size appropriate to place a SCS generator. The pocket was trialed, and found to be of adequate size. The pocket was inspected for hemostasis, which was found to be excellent. Using reverse seldinger technique, the leads were tunneled to the pocket site, and the leads inserted into the SCS generator. Impedances were checked, and all found to be excellent. The leads were then all fixed into position with a self-torquing wrench. The wiring was all carefully coiled, placed behind the generator and placed in the pocket.  Both incisions were copiously irrigated with bacitracin-containing irrigation. The lumbar incision was closed in 2 deep layers of interrupted 2-0 vicryl and the skin closed with staples. The pocket incision was closed with a deeper layer of 2-0 vicryl interrupted sutures, and the skin closed with staples. Sterile dressings were applied. Needle, sponge, and instrument counts were correct x2 at the end of the case.  The patient was then carefully awakened from anesthesia, turned supine, an abdominal binder placed, and the patient taken to the recovery room where he underwent complex spinal cord stimulator programming.  COMPLICATIONS: NONE  CONDITION: Stable throughout the course of the  procedure and immediately afterward  DISPOSITION: discharge to home, with antibiotics and  pain medicine. Discussed care with the patient and spouse. Followup in clinic will be scheduled in 10-14 days.

## 2017-08-01 ENCOUNTER — Encounter (HOSPITAL_COMMUNITY): Payer: Self-pay | Admitting: Anesthesiology

## 2017-09-09 DIAGNOSIS — Z1331 Encounter for screening for depression: Secondary | ICD-10-CM | POA: Diagnosis not present

## 2017-09-09 DIAGNOSIS — R030 Elevated blood-pressure reading, without diagnosis of hypertension: Secondary | ICD-10-CM | POA: Diagnosis not present

## 2017-09-09 DIAGNOSIS — G894 Chronic pain syndrome: Secondary | ICD-10-CM | POA: Diagnosis not present

## 2017-09-09 DIAGNOSIS — Z23 Encounter for immunization: Secondary | ICD-10-CM | POA: Diagnosis not present

## 2017-09-09 DIAGNOSIS — Z125 Encounter for screening for malignant neoplasm of prostate: Secondary | ICD-10-CM | POA: Diagnosis not present

## 2017-09-09 DIAGNOSIS — Z6828 Body mass index (BMI) 28.0-28.9, adult: Secondary | ICD-10-CM | POA: Diagnosis not present

## 2017-09-09 DIAGNOSIS — Z9181 History of falling: Secondary | ICD-10-CM | POA: Diagnosis not present

## 2017-09-09 DIAGNOSIS — Z0000 Encounter for general adult medical examination without abnormal findings: Secondary | ICD-10-CM | POA: Diagnosis not present

## 2017-09-09 DIAGNOSIS — E785 Hyperlipidemia, unspecified: Secondary | ICD-10-CM | POA: Diagnosis not present

## 2017-09-14 DIAGNOSIS — Z1331 Encounter for screening for depression: Secondary | ICD-10-CM | POA: Diagnosis not present

## 2017-09-14 DIAGNOSIS — F5104 Psychophysiologic insomnia: Secondary | ICD-10-CM | POA: Diagnosis not present

## 2017-09-14 DIAGNOSIS — E785 Hyperlipidemia, unspecified: Secondary | ICD-10-CM | POA: Diagnosis not present

## 2017-09-14 DIAGNOSIS — N644 Mastodynia: Secondary | ICD-10-CM | POA: Diagnosis not present

## 2017-09-14 DIAGNOSIS — Z6827 Body mass index (BMI) 27.0-27.9, adult: Secondary | ICD-10-CM | POA: Diagnosis not present

## 2017-09-14 DIAGNOSIS — F411 Generalized anxiety disorder: Secondary | ICD-10-CM | POA: Diagnosis not present

## 2017-09-14 DIAGNOSIS — K219 Gastro-esophageal reflux disease without esophagitis: Secondary | ICD-10-CM | POA: Diagnosis not present

## 2017-09-14 DIAGNOSIS — M5416 Radiculopathy, lumbar region: Secondary | ICD-10-CM | POA: Diagnosis not present

## 2017-09-27 DIAGNOSIS — N644 Mastodynia: Secondary | ICD-10-CM | POA: Diagnosis not present

## 2017-09-27 DIAGNOSIS — N6489 Other specified disorders of breast: Secondary | ICD-10-CM | POA: Diagnosis not present

## 2017-09-27 DIAGNOSIS — R928 Other abnormal and inconclusive findings on diagnostic imaging of breast: Secondary | ICD-10-CM | POA: Diagnosis not present

## 2017-11-09 DIAGNOSIS — H18413 Arcus senilis, bilateral: Secondary | ICD-10-CM | POA: Diagnosis not present

## 2017-11-09 DIAGNOSIS — H25813 Combined forms of age-related cataract, bilateral: Secondary | ICD-10-CM | POA: Diagnosis not present

## 2017-11-24 DIAGNOSIS — L578 Other skin changes due to chronic exposure to nonionizing radiation: Secondary | ICD-10-CM | POA: Diagnosis not present

## 2017-11-24 DIAGNOSIS — L219 Seborrheic dermatitis, unspecified: Secondary | ICD-10-CM | POA: Diagnosis not present

## 2017-12-13 DIAGNOSIS — E785 Hyperlipidemia, unspecified: Secondary | ICD-10-CM | POA: Diagnosis not present

## 2017-12-13 DIAGNOSIS — Z6827 Body mass index (BMI) 27.0-27.9, adult: Secondary | ICD-10-CM | POA: Diagnosis not present

## 2017-12-13 DIAGNOSIS — F5104 Psychophysiologic insomnia: Secondary | ICD-10-CM | POA: Diagnosis not present

## 2018-02-25 DIAGNOSIS — R109 Unspecified abdominal pain: Secondary | ICD-10-CM | POA: Diagnosis not present

## 2018-02-25 DIAGNOSIS — N201 Calculus of ureter: Secondary | ICD-10-CM | POA: Diagnosis not present

## 2018-02-25 DIAGNOSIS — Z87442 Personal history of urinary calculi: Secondary | ICD-10-CM | POA: Diagnosis not present

## 2018-03-15 DIAGNOSIS — M5416 Radiculopathy, lumbar region: Secondary | ICD-10-CM | POA: Diagnosis not present

## 2018-03-15 DIAGNOSIS — Z1339 Encounter for screening examination for other mental health and behavioral disorders: Secondary | ICD-10-CM | POA: Diagnosis not present

## 2018-03-15 DIAGNOSIS — F5104 Psychophysiologic insomnia: Secondary | ICD-10-CM | POA: Diagnosis not present

## 2018-03-15 DIAGNOSIS — Z6827 Body mass index (BMI) 27.0-27.9, adult: Secondary | ICD-10-CM | POA: Diagnosis not present

## 2018-03-15 DIAGNOSIS — K219 Gastro-esophageal reflux disease without esophagitis: Secondary | ICD-10-CM | POA: Diagnosis not present

## 2018-03-15 DIAGNOSIS — E785 Hyperlipidemia, unspecified: Secondary | ICD-10-CM | POA: Diagnosis not present

## 2018-03-15 DIAGNOSIS — J309 Allergic rhinitis, unspecified: Secondary | ICD-10-CM | POA: Diagnosis not present

## 2018-04-04 DIAGNOSIS — Z86010 Personal history of colonic polyps: Secondary | ICD-10-CM | POA: Diagnosis not present

## 2018-06-02 DIAGNOSIS — Z6827 Body mass index (BMI) 27.0-27.9, adult: Secondary | ICD-10-CM | POA: Diagnosis not present

## 2018-06-02 DIAGNOSIS — R51 Headache: Secondary | ICD-10-CM | POA: Diagnosis not present

## 2018-06-02 DIAGNOSIS — M5481 Occipital neuralgia: Secondary | ICD-10-CM | POA: Diagnosis not present

## 2018-07-24 IMAGING — RF DG THORACIC SPINE 1V
1 series · 1 of 1 positions shown · non-contrast
Comparison: None.

FLUOROSCOPY TIME:  4 minutes 15 seconds

CLINICAL DATA: Thoracic stimulator placement

EXAM:
DG C-ARM 61-120 MIN; THORACIC SPINE - 1 VIEW

[Series 1: run · 1 of 1 slices shown]
[im 1/1]
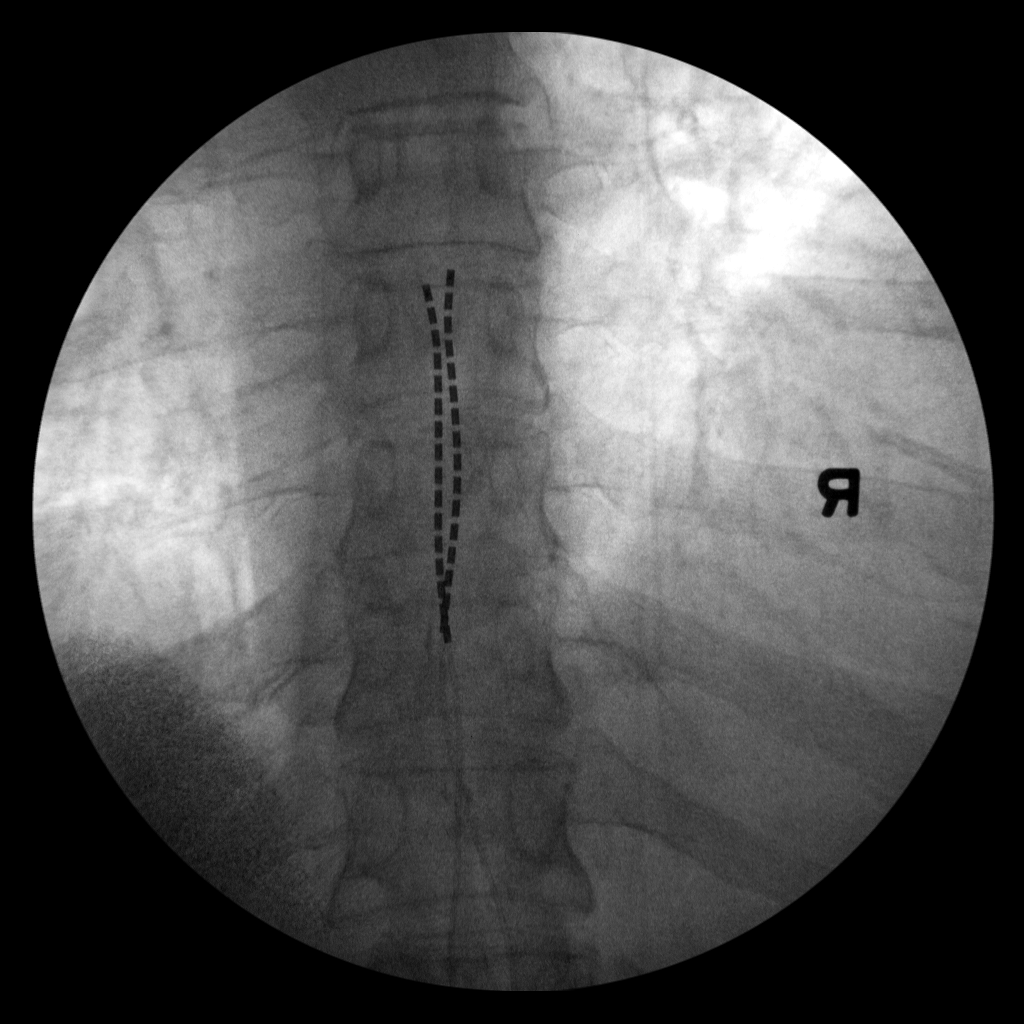

[1 of 1 positions shown; findings below may reference images not displayed]

FINDINGS: Single fluoroscopic image during thoracic stimulator spine
placement.

Frontal view demonstrates thoracic stimulator leads overlying the
lower thoracic spine, possibly T8-10.
IMPRESSION: Intraoperative fluoroscopic image during thoracic stimulator spine
placement, as above.

## 2018-07-26 ENCOUNTER — Encounter: Payer: Self-pay | Admitting: *Deleted

## 2018-07-27 ENCOUNTER — Telehealth: Payer: Self-pay | Admitting: Neurology

## 2018-07-27 ENCOUNTER — Ambulatory Visit: Payer: PPO | Admitting: Neurology

## 2018-07-27 NOTE — Telephone Encounter (Signed)
This patient did not show for a new patient appointment today. 

## 2018-08-02 ENCOUNTER — Encounter: Payer: Self-pay | Admitting: Neurology

## 2018-10-27 DIAGNOSIS — F5104 Psychophysiologic insomnia: Secondary | ICD-10-CM | POA: Diagnosis not present

## 2018-10-27 DIAGNOSIS — J309 Allergic rhinitis, unspecified: Secondary | ICD-10-CM | POA: Diagnosis not present

## 2018-10-27 DIAGNOSIS — F411 Generalized anxiety disorder: Secondary | ICD-10-CM | POA: Diagnosis not present

## 2018-10-27 DIAGNOSIS — M79672 Pain in left foot: Secondary | ICD-10-CM | POA: Diagnosis not present

## 2018-10-27 DIAGNOSIS — M5481 Occipital neuralgia: Secondary | ICD-10-CM | POA: Diagnosis not present

## 2018-10-27 DIAGNOSIS — Z6828 Body mass index (BMI) 28.0-28.9, adult: Secondary | ICD-10-CM | POA: Diagnosis not present

## 2018-10-27 DIAGNOSIS — K219 Gastro-esophageal reflux disease without esophagitis: Secondary | ICD-10-CM | POA: Diagnosis not present

## 2018-10-27 DIAGNOSIS — R609 Edema, unspecified: Secondary | ICD-10-CM | POA: Diagnosis not present

## 2018-10-27 DIAGNOSIS — E785 Hyperlipidemia, unspecified: Secondary | ICD-10-CM | POA: Diagnosis not present

## 2018-11-15 ENCOUNTER — Ambulatory Visit: Payer: PPO | Admitting: Podiatry

## 2018-11-22 ENCOUNTER — Ambulatory Visit: Payer: PPO | Admitting: Podiatry

## 2018-12-13 ENCOUNTER — Ambulatory Visit: Payer: PPO | Admitting: Podiatry

## 2019-03-01 ENCOUNTER — Ambulatory Visit: Payer: PPO | Admitting: Sports Medicine

## 2019-03-31 ENCOUNTER — Other Ambulatory Visit: Payer: Self-pay

## 2019-06-05 DIAGNOSIS — B9689 Other specified bacterial agents as the cause of diseases classified elsewhere: Secondary | ICD-10-CM | POA: Diagnosis not present

## 2019-06-05 DIAGNOSIS — J019 Acute sinusitis, unspecified: Secondary | ICD-10-CM | POA: Diagnosis not present

## 2019-06-05 DIAGNOSIS — J309 Allergic rhinitis, unspecified: Secondary | ICD-10-CM | POA: Diagnosis not present

## 2019-06-05 DIAGNOSIS — F5104 Psychophysiologic insomnia: Secondary | ICD-10-CM | POA: Diagnosis not present

## 2019-06-05 DIAGNOSIS — F411 Generalized anxiety disorder: Secondary | ICD-10-CM | POA: Diagnosis not present

## 2019-11-22 DIAGNOSIS — M961 Postlaminectomy syndrome, not elsewhere classified: Secondary | ICD-10-CM | POA: Diagnosis not present

## 2019-11-30 DIAGNOSIS — K219 Gastro-esophageal reflux disease without esophagitis: Secondary | ICD-10-CM | POA: Diagnosis not present

## 2019-11-30 DIAGNOSIS — L290 Pruritus ani: Secondary | ICD-10-CM | POA: Diagnosis not present

## 2020-01-01 DIAGNOSIS — M961 Postlaminectomy syndrome, not elsewhere classified: Secondary | ICD-10-CM | POA: Diagnosis not present

## 2020-01-02 DIAGNOSIS — F5104 Psychophysiologic insomnia: Secondary | ICD-10-CM | POA: Diagnosis not present

## 2020-01-02 DIAGNOSIS — R7309 Other abnormal glucose: Secondary | ICD-10-CM | POA: Diagnosis not present

## 2020-01-02 DIAGNOSIS — F411 Generalized anxiety disorder: Secondary | ICD-10-CM | POA: Diagnosis not present

## 2020-01-02 DIAGNOSIS — Z6828 Body mass index (BMI) 28.0-28.9, adult: Secondary | ICD-10-CM | POA: Diagnosis not present

## 2020-01-02 DIAGNOSIS — E785 Hyperlipidemia, unspecified: Secondary | ICD-10-CM | POA: Diagnosis not present

## 2020-01-02 DIAGNOSIS — K219 Gastro-esophageal reflux disease without esophagitis: Secondary | ICD-10-CM | POA: Diagnosis not present

## 2020-01-02 DIAGNOSIS — J309 Allergic rhinitis, unspecified: Secondary | ICD-10-CM | POA: Diagnosis not present

## 2020-01-02 DIAGNOSIS — R609 Edema, unspecified: Secondary | ICD-10-CM | POA: Diagnosis not present

## 2020-01-02 DIAGNOSIS — M5481 Occipital neuralgia: Secondary | ICD-10-CM | POA: Diagnosis not present

## 2020-02-08 DIAGNOSIS — M810 Age-related osteoporosis without current pathological fracture: Secondary | ICD-10-CM | POA: Diagnosis not present

## 2020-02-19 DIAGNOSIS — L219 Seborrheic dermatitis, unspecified: Secondary | ICD-10-CM | POA: Diagnosis not present

## 2020-02-19 DIAGNOSIS — L820 Inflamed seborrheic keratosis: Secondary | ICD-10-CM | POA: Diagnosis not present

## 2020-02-19 DIAGNOSIS — L304 Erythema intertrigo: Secondary | ICD-10-CM | POA: Diagnosis not present

## 2020-02-19 DIAGNOSIS — L578 Other skin changes due to chronic exposure to nonionizing radiation: Secondary | ICD-10-CM | POA: Diagnosis not present

## 2020-03-26 ENCOUNTER — Other Ambulatory Visit: Payer: Self-pay

## 2020-04-19 DIAGNOSIS — J3489 Other specified disorders of nose and nasal sinuses: Secondary | ICD-10-CM | POA: Diagnosis not present

## 2020-04-19 DIAGNOSIS — Z20828 Contact with and (suspected) exposure to other viral communicable diseases: Secondary | ICD-10-CM | POA: Diagnosis not present

## 2020-04-23 DIAGNOSIS — S335XXA Sprain of ligaments of lumbar spine, initial encounter: Secondary | ICD-10-CM | POA: Diagnosis not present

## 2020-04-23 DIAGNOSIS — M5416 Radiculopathy, lumbar region: Secondary | ICD-10-CM | POA: Diagnosis not present

## 2020-04-23 DIAGNOSIS — S339XXA Sprain of unspecified parts of lumbar spine and pelvis, initial encounter: Secondary | ICD-10-CM | POA: Diagnosis not present

## 2020-04-23 DIAGNOSIS — M545 Low back pain: Secondary | ICD-10-CM | POA: Diagnosis not present

## 2020-04-30 DIAGNOSIS — M545 Low back pain: Secondary | ICD-10-CM | POA: Diagnosis not present

## 2020-10-23 DIAGNOSIS — M545 Low back pain, unspecified: Secondary | ICD-10-CM | POA: Diagnosis not present

## 2020-10-23 DIAGNOSIS — M5416 Radiculopathy, lumbar region: Secondary | ICD-10-CM | POA: Diagnosis not present

## 2020-10-23 DIAGNOSIS — M7918 Myalgia, other site: Secondary | ICD-10-CM | POA: Diagnosis not present

## 2020-10-23 DIAGNOSIS — M25511 Pain in right shoulder: Secondary | ICD-10-CM | POA: Diagnosis not present

## 2020-10-23 DIAGNOSIS — Z9689 Presence of other specified functional implants: Secondary | ICD-10-CM | POA: Diagnosis not present

## 2020-12-05 DIAGNOSIS — J309 Allergic rhinitis, unspecified: Secondary | ICD-10-CM | POA: Diagnosis not present

## 2020-12-05 DIAGNOSIS — Z6826 Body mass index (BMI) 26.0-26.9, adult: Secondary | ICD-10-CM | POA: Diagnosis not present

## 2020-12-05 DIAGNOSIS — F5104 Psychophysiologic insomnia: Secondary | ICD-10-CM | POA: Diagnosis not present

## 2020-12-05 DIAGNOSIS — K219 Gastro-esophageal reflux disease without esophagitis: Secondary | ICD-10-CM | POA: Diagnosis not present

## 2020-12-05 DIAGNOSIS — R7309 Other abnormal glucose: Secondary | ICD-10-CM | POA: Diagnosis not present

## 2020-12-05 DIAGNOSIS — Z1331 Encounter for screening for depression: Secondary | ICD-10-CM | POA: Diagnosis not present

## 2020-12-05 DIAGNOSIS — F411 Generalized anxiety disorder: Secondary | ICD-10-CM | POA: Diagnosis not present

## 2020-12-05 DIAGNOSIS — Z9181 History of falling: Secondary | ICD-10-CM | POA: Diagnosis not present

## 2020-12-05 DIAGNOSIS — M5481 Occipital neuralgia: Secondary | ICD-10-CM | POA: Diagnosis not present

## 2020-12-05 DIAGNOSIS — E785 Hyperlipidemia, unspecified: Secondary | ICD-10-CM | POA: Diagnosis not present

## 2021-01-29 DIAGNOSIS — M961 Postlaminectomy syndrome, not elsewhere classified: Secondary | ICD-10-CM | POA: Diagnosis not present

## 2021-01-29 DIAGNOSIS — M47812 Spondylosis without myelopathy or radiculopathy, cervical region: Secondary | ICD-10-CM | POA: Diagnosis not present

## 2021-02-24 DIAGNOSIS — M47812 Spondylosis without myelopathy or radiculopathy, cervical region: Secondary | ICD-10-CM | POA: Diagnosis not present

## 2021-05-22 DIAGNOSIS — M4722 Other spondylosis with radiculopathy, cervical region: Secondary | ICD-10-CM | POA: Diagnosis not present

## 2021-05-22 DIAGNOSIS — M542 Cervicalgia: Secondary | ICD-10-CM | POA: Diagnosis not present

## 2021-05-22 DIAGNOSIS — R030 Elevated blood-pressure reading, without diagnosis of hypertension: Secondary | ICD-10-CM | POA: Diagnosis not present

## 2021-05-29 DIAGNOSIS — K2270 Barrett's esophagus without dysplasia: Secondary | ICD-10-CM | POA: Diagnosis not present

## 2021-06-17 DIAGNOSIS — J309 Allergic rhinitis, unspecified: Secondary | ICD-10-CM | POA: Diagnosis not present

## 2021-06-17 DIAGNOSIS — M5481 Occipital neuralgia: Secondary | ICD-10-CM | POA: Diagnosis not present

## 2021-06-17 DIAGNOSIS — Z6826 Body mass index (BMI) 26.0-26.9, adult: Secondary | ICD-10-CM | POA: Diagnosis not present

## 2021-06-17 DIAGNOSIS — R030 Elevated blood-pressure reading, without diagnosis of hypertension: Secondary | ICD-10-CM | POA: Diagnosis not present

## 2021-06-17 DIAGNOSIS — E785 Hyperlipidemia, unspecified: Secondary | ICD-10-CM | POA: Diagnosis not present

## 2021-06-17 DIAGNOSIS — M542 Cervicalgia: Secondary | ICD-10-CM | POA: Diagnosis not present

## 2021-06-17 DIAGNOSIS — F411 Generalized anxiety disorder: Secondary | ICD-10-CM | POA: Diagnosis not present

## 2021-06-17 DIAGNOSIS — R7309 Other abnormal glucose: Secondary | ICD-10-CM | POA: Diagnosis not present

## 2021-06-17 DIAGNOSIS — K219 Gastro-esophageal reflux disease without esophagitis: Secondary | ICD-10-CM | POA: Diagnosis not present

## 2021-06-17 DIAGNOSIS — F5104 Psychophysiologic insomnia: Secondary | ICD-10-CM | POA: Diagnosis not present

## 2021-06-18 DIAGNOSIS — E785 Hyperlipidemia, unspecified: Secondary | ICD-10-CM | POA: Diagnosis not present

## 2021-07-30 DIAGNOSIS — K219 Gastro-esophageal reflux disease without esophagitis: Secondary | ICD-10-CM | POA: Diagnosis not present

## 2021-07-30 DIAGNOSIS — D175 Benign lipomatous neoplasm of intra-abdominal organs: Secondary | ICD-10-CM | POA: Diagnosis not present

## 2021-07-30 DIAGNOSIS — K2270 Barrett's esophagus without dysplasia: Secondary | ICD-10-CM | POA: Diagnosis not present

## 2021-10-20 DIAGNOSIS — L820 Inflamed seborrheic keratosis: Secondary | ICD-10-CM | POA: Diagnosis not present

## 2021-10-20 DIAGNOSIS — C44319 Basal cell carcinoma of skin of other parts of face: Secondary | ICD-10-CM | POA: Diagnosis not present

## 2021-10-20 DIAGNOSIS — L821 Other seborrheic keratosis: Secondary | ICD-10-CM | POA: Diagnosis not present

## 2021-10-20 DIAGNOSIS — L219 Seborrheic dermatitis, unspecified: Secondary | ICD-10-CM | POA: Diagnosis not present

## 2021-10-20 DIAGNOSIS — L578 Other skin changes due to chronic exposure to nonionizing radiation: Secondary | ICD-10-CM | POA: Diagnosis not present

## 2021-11-05 DIAGNOSIS — M47812 Spondylosis without myelopathy or radiculopathy, cervical region: Secondary | ICD-10-CM | POA: Diagnosis not present

## 2021-11-05 DIAGNOSIS — M542 Cervicalgia: Secondary | ICD-10-CM | POA: Diagnosis not present

## 2021-11-05 DIAGNOSIS — R030 Elevated blood-pressure reading, without diagnosis of hypertension: Secondary | ICD-10-CM | POA: Diagnosis not present

## 2021-11-05 DIAGNOSIS — M5416 Radiculopathy, lumbar region: Secondary | ICD-10-CM | POA: Diagnosis not present

## 2021-11-07 ENCOUNTER — Other Ambulatory Visit: Payer: Self-pay | Admitting: Neurological Surgery

## 2021-11-07 ENCOUNTER — Other Ambulatory Visit (HOSPITAL_COMMUNITY): Payer: Self-pay | Admitting: Neurological Surgery

## 2021-11-07 DIAGNOSIS — M5416 Radiculopathy, lumbar region: Secondary | ICD-10-CM

## 2021-11-07 DIAGNOSIS — M47812 Spondylosis without myelopathy or radiculopathy, cervical region: Secondary | ICD-10-CM

## 2021-11-19 ENCOUNTER — Encounter (HOSPITAL_COMMUNITY): Payer: Self-pay

## 2021-11-19 ENCOUNTER — Ambulatory Visit (HOSPITAL_COMMUNITY)
Admission: RE | Admit: 2021-11-19 | Discharge: 2021-11-19 | Disposition: A | Discharge status: Home / Self Care | Payer: PPO | Source: Ambulatory Visit | Attending: Neurological Surgery | Admitting: Neurological Surgery

## 2021-11-19 ENCOUNTER — Ambulatory Visit (HOSPITAL_COMMUNITY): Admission: RE | Admit: 2021-11-19 | Payer: PPO | Source: Ambulatory Visit

## 2021-11-19 ENCOUNTER — Other Ambulatory Visit: Payer: Self-pay

## 2021-11-19 DIAGNOSIS — M5416 Radiculopathy, lumbar region: Secondary | ICD-10-CM

## 2021-11-19 NOTE — Progress Notes (Signed)
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 A MRI of the cervical and lumbar spine was ordered and not able to be performed. His lead length is 70cm and guidelines state that they must be 50cm to be MRI eligible. This was explained to the patient who was understanding yet somewhat frustrated as he drove a long distance to get this MRI done. They were very thankful of our attention to safety. I called Dr Rubbie Battiest office and left message to inform them of this. Awaiting a return call. ?

## 2022-01-01 DIAGNOSIS — M170 Bilateral primary osteoarthritis of knee: Secondary | ICD-10-CM | POA: Diagnosis not present

## 2022-02-24 DIAGNOSIS — E785 Hyperlipidemia, unspecified: Secondary | ICD-10-CM | POA: Diagnosis not present

## 2022-02-24 DIAGNOSIS — F411 Generalized anxiety disorder: Secondary | ICD-10-CM | POA: Diagnosis not present

## 2022-02-24 DIAGNOSIS — M542 Cervicalgia: Secondary | ICD-10-CM | POA: Diagnosis not present

## 2022-02-24 DIAGNOSIS — R7309 Other abnormal glucose: Secondary | ICD-10-CM | POA: Diagnosis not present

## 2022-02-24 DIAGNOSIS — J309 Allergic rhinitis, unspecified: Secondary | ICD-10-CM | POA: Diagnosis not present

## 2022-02-24 DIAGNOSIS — K219 Gastro-esophageal reflux disease without esophagitis: Secondary | ICD-10-CM | POA: Diagnosis not present

## 2022-02-24 DIAGNOSIS — F5104 Psychophysiologic insomnia: Secondary | ICD-10-CM | POA: Diagnosis not present

## 2022-02-24 DIAGNOSIS — M5481 Occipital neuralgia: Secondary | ICD-10-CM | POA: Diagnosis not present

## 2022-03-25 DIAGNOSIS — M170 Bilateral primary osteoarthritis of knee: Secondary | ICD-10-CM | POA: Diagnosis not present

## 2022-04-08 DIAGNOSIS — R051 Acute cough: Secondary | ICD-10-CM | POA: Diagnosis not present

## 2022-04-08 DIAGNOSIS — J209 Acute bronchitis, unspecified: Secondary | ICD-10-CM | POA: Diagnosis not present

## 2022-04-15 DIAGNOSIS — M1712 Unilateral primary osteoarthritis, left knee: Secondary | ICD-10-CM | POA: Diagnosis not present

## 2022-04-22 DIAGNOSIS — M1712 Unilateral primary osteoarthritis, left knee: Secondary | ICD-10-CM | POA: Diagnosis not present

## 2022-04-29 DIAGNOSIS — M1712 Unilateral primary osteoarthritis, left knee: Secondary | ICD-10-CM | POA: Diagnosis not present

## 2022-05-20 DIAGNOSIS — M1711 Unilateral primary osteoarthritis, right knee: Secondary | ICD-10-CM | POA: Diagnosis not present

## 2022-05-27 DIAGNOSIS — M1711 Unilateral primary osteoarthritis, right knee: Secondary | ICD-10-CM | POA: Diagnosis not present

## 2022-06-03 DIAGNOSIS — R0981 Nasal congestion: Secondary | ICD-10-CM | POA: Diagnosis not present

## 2022-06-03 DIAGNOSIS — J0100 Acute maxillary sinusitis, unspecified: Secondary | ICD-10-CM | POA: Diagnosis not present

## 2022-06-11 DIAGNOSIS — M1711 Unilateral primary osteoarthritis, right knee: Secondary | ICD-10-CM | POA: Diagnosis not present

## 2022-08-31 HISTORY — PX: SPINAL CORD STIMULATOR REMOVAL: SHX2423

## 2022-09-07 DIAGNOSIS — K219 Gastro-esophageal reflux disease without esophagitis: Secondary | ICD-10-CM | POA: Diagnosis not present

## 2022-09-07 DIAGNOSIS — Z1331 Encounter for screening for depression: Secondary | ICD-10-CM | POA: Diagnosis not present

## 2022-09-07 DIAGNOSIS — E785 Hyperlipidemia, unspecified: Secondary | ICD-10-CM | POA: Diagnosis not present

## 2022-09-07 DIAGNOSIS — J309 Allergic rhinitis, unspecified: Secondary | ICD-10-CM | POA: Diagnosis not present

## 2022-09-07 DIAGNOSIS — E538 Deficiency of other specified B group vitamins: Secondary | ICD-10-CM | POA: Diagnosis not present

## 2022-09-07 DIAGNOSIS — R262 Difficulty in walking, not elsewhere classified: Secondary | ICD-10-CM | POA: Diagnosis not present

## 2022-09-07 DIAGNOSIS — M542 Cervicalgia: Secondary | ICD-10-CM | POA: Diagnosis not present

## 2022-09-07 DIAGNOSIS — Z139 Encounter for screening, unspecified: Secondary | ICD-10-CM | POA: Diagnosis not present

## 2022-09-07 DIAGNOSIS — Z9181 History of falling: Secondary | ICD-10-CM | POA: Diagnosis not present

## 2022-09-07 DIAGNOSIS — M5481 Occipital neuralgia: Secondary | ICD-10-CM | POA: Diagnosis not present

## 2022-09-07 DIAGNOSIS — F411 Generalized anxiety disorder: Secondary | ICD-10-CM | POA: Diagnosis not present

## 2022-09-07 DIAGNOSIS — R7309 Other abnormal glucose: Secondary | ICD-10-CM | POA: Diagnosis not present

## 2022-09-07 DIAGNOSIS — Z79899 Other long term (current) drug therapy: Secondary | ICD-10-CM | POA: Diagnosis not present

## 2022-09-07 DIAGNOSIS — F5104 Psychophysiologic insomnia: Secondary | ICD-10-CM | POA: Diagnosis not present

## 2022-09-07 DIAGNOSIS — Z125 Encounter for screening for malignant neoplasm of prostate: Secondary | ICD-10-CM | POA: Diagnosis not present

## 2022-09-10 DIAGNOSIS — E538 Deficiency of other specified B group vitamins: Secondary | ICD-10-CM | POA: Diagnosis not present

## 2022-09-22 DIAGNOSIS — E538 Deficiency of other specified B group vitamins: Secondary | ICD-10-CM | POA: Diagnosis not present

## 2022-09-23 DIAGNOSIS — Z9689 Presence of other specified functional implants: Secondary | ICD-10-CM | POA: Diagnosis not present

## 2022-09-23 DIAGNOSIS — M542 Cervicalgia: Secondary | ICD-10-CM | POA: Diagnosis not present

## 2022-09-23 DIAGNOSIS — M5416 Radiculopathy, lumbar region: Secondary | ICD-10-CM | POA: Diagnosis not present

## 2022-09-29 DIAGNOSIS — E538 Deficiency of other specified B group vitamins: Secondary | ICD-10-CM | POA: Diagnosis not present

## 2022-11-12 DIAGNOSIS — Z1331 Encounter for screening for depression: Secondary | ICD-10-CM | POA: Diagnosis not present

## 2022-11-12 DIAGNOSIS — Z139 Encounter for screening, unspecified: Secondary | ICD-10-CM | POA: Diagnosis not present

## 2022-11-12 DIAGNOSIS — E538 Deficiency of other specified B group vitamins: Secondary | ICD-10-CM | POA: Diagnosis not present

## 2022-11-12 DIAGNOSIS — R7309 Other abnormal glucose: Secondary | ICD-10-CM | POA: Diagnosis not present

## 2022-11-12 DIAGNOSIS — J309 Allergic rhinitis, unspecified: Secondary | ICD-10-CM | POA: Diagnosis not present

## 2022-11-12 DIAGNOSIS — Z79899 Other long term (current) drug therapy: Secondary | ICD-10-CM | POA: Diagnosis not present

## 2022-11-12 DIAGNOSIS — I10 Essential (primary) hypertension: Secondary | ICD-10-CM | POA: Diagnosis not present

## 2022-11-12 DIAGNOSIS — E785 Hyperlipidemia, unspecified: Secondary | ICD-10-CM | POA: Diagnosis not present

## 2022-11-12 DIAGNOSIS — F411 Generalized anxiety disorder: Secondary | ICD-10-CM | POA: Diagnosis not present

## 2022-11-12 DIAGNOSIS — Z9181 History of falling: Secondary | ICD-10-CM | POA: Diagnosis not present

## 2022-11-12 DIAGNOSIS — F5104 Psychophysiologic insomnia: Secondary | ICD-10-CM | POA: Diagnosis not present

## 2022-11-20 DIAGNOSIS — T85112A Breakdown (mechanical) of implanted electronic neurostimulator (electrode) of spinal cord, initial encounter: Secondary | ICD-10-CM | POA: Diagnosis not present

## 2022-11-20 DIAGNOSIS — T859XXA Unspecified complication of internal prosthetic device, implant and graft, initial encounter: Secondary | ICD-10-CM | POA: Diagnosis not present

## 2022-11-20 DIAGNOSIS — Z4542 Encounter for adjustment and management of neuropacemaker (brain) (peripheral nerve) (spinal cord): Secondary | ICD-10-CM | POA: Diagnosis not present

## 2022-12-03 DIAGNOSIS — Z6827 Body mass index (BMI) 27.0-27.9, adult: Secondary | ICD-10-CM | POA: Diagnosis not present

## 2022-12-03 DIAGNOSIS — M6281 Muscle weakness (generalized): Secondary | ICD-10-CM | POA: Diagnosis not present

## 2022-12-16 DIAGNOSIS — E538 Deficiency of other specified B group vitamins: Secondary | ICD-10-CM | POA: Diagnosis not present

## 2022-12-17 DIAGNOSIS — M21372 Foot drop, left foot: Secondary | ICD-10-CM | POA: Diagnosis not present

## 2022-12-17 DIAGNOSIS — M545 Low back pain, unspecified: Secondary | ICD-10-CM | POA: Diagnosis not present

## 2022-12-17 DIAGNOSIS — M6281 Muscle weakness (generalized): Secondary | ICD-10-CM | POA: Diagnosis not present

## 2022-12-17 DIAGNOSIS — M4316 Spondylolisthesis, lumbar region: Secondary | ICD-10-CM | POA: Diagnosis not present

## 2022-12-17 DIAGNOSIS — M25552 Pain in left hip: Secondary | ICD-10-CM | POA: Diagnosis not present

## 2022-12-31 DIAGNOSIS — M48062 Spinal stenosis, lumbar region with neurogenic claudication: Secondary | ICD-10-CM | POA: Diagnosis not present

## 2023-02-03 DIAGNOSIS — Z6826 Body mass index (BMI) 26.0-26.9, adult: Secondary | ICD-10-CM | POA: Diagnosis not present

## 2023-02-03 DIAGNOSIS — M48062 Spinal stenosis, lumbar region with neurogenic claudication: Secondary | ICD-10-CM | POA: Diagnosis not present

## 2023-02-03 DIAGNOSIS — G959 Disease of spinal cord, unspecified: Secondary | ICD-10-CM | POA: Diagnosis not present

## 2023-02-16 DIAGNOSIS — M4802 Spinal stenosis, cervical region: Secondary | ICD-10-CM | POA: Diagnosis not present

## 2023-02-16 DIAGNOSIS — M47812 Spondylosis without myelopathy or radiculopathy, cervical region: Secondary | ICD-10-CM | POA: Diagnosis not present

## 2023-02-16 DIAGNOSIS — G959 Disease of spinal cord, unspecified: Secondary | ICD-10-CM | POA: Diagnosis not present

## 2023-03-03 DIAGNOSIS — M48062 Spinal stenosis, lumbar region with neurogenic claudication: Secondary | ICD-10-CM | POA: Diagnosis not present

## 2023-03-15 ENCOUNTER — Other Ambulatory Visit: Payer: Self-pay | Admitting: Neurological Surgery

## 2023-03-18 ENCOUNTER — Other Ambulatory Visit: Payer: Self-pay | Admitting: Neurological Surgery

## 2023-03-23 ENCOUNTER — Encounter (HOSPITAL_COMMUNITY): Payer: Self-pay

## 2023-03-23 NOTE — Pre-Procedure Instructions (Signed)
Surgical Instructions   Your procedure is scheduled on Friday, August 2nd. Report to [facility] Main Entrance "A" at 10:30 A.M., then check in with the Admitting office. Any questions or running late day of surgery: call [phone]  Questions prior to your surgery date: call [phone], Monday-Friday, 8am-4pm. If you experience any cold or flu symptoms such as cough, fever, chills, shortness of breath, etc. between now and your scheduled surgery, please notify us at the above number.     Remember:  Do not eat or drink after midnight the night before your surgery    Take these medicines the morning of surgery with A SIP OF WATER  dexlansoprazole (DEXILANT)  gabapentin (NEURONTIN)  venlafaxine XR [facility])    May take these medicines IF NEEDED: acetaminophen (TYLENOL)  albuterol (VENTOLIN HFA)- bring inhaler with you on day of surgery diphenhydrAMINE (BENADRYL)    One week prior to surgery, STOP taking any Aspirin (unless otherwise instructed by your surgeon) Aleve, Naproxen, Ibuprofen, Motrin, Advil, Goody's, BC's, all herbal medications, fish oil, and non-prescription vitamins.                     Do NOT Smoke (Tobacco/Vaping) for 24 hours prior to your procedure.  If you use a CPAP at night, you may bring your mask/headgear for your overnight stay.   You will be asked to remove any contacts, glasses, piercing's, hearing aid's, dentures/partials prior to surgery. Please bring cases for these items if needed.    Patients discharged the day of surgery will not be allowed to drive home, and someone needs to stay with them for 24 hours.  SURGICAL WAITING ROOM VISITATION Patients may have no more than 2 support people in the waiting area - these visitors may rotate.   Pre-op nurse will coordinate an appropriate time for 1 ADULT support person, who may not rotate, to accompany patient in pre-op.  Children under the age of 84 must have an adult with them who is not the  patient and must remain in the main waiting area with an adult.  If the patient needs to stay at the hospital during part of their recovery, the visitor guidelines for inpatient rooms apply.  Please refer to the [facility] website for the visitor guidelines for any additional information.   If you received a COVID test during your pre-op visit  it is requested that you wear a mask when out in public, stay away from anyone that may not be feeling well and notify your surgeon if you develop symptoms. If you have been in contact with anyone that has tested positive in the last 10 days please notify you surgeon.      Pre-operative 5 CHG Bathing Instructions   You can play a key role in reducing the risk of infection after surgery. Your skin needs to be as free of germs as possible. You can reduce the number of germs on your skin by washing with CHG (chlorhexidine gluconate) soap before surgery. CHG is an antiseptic soap that kills germs and continues to kill germs even after washing.   DO NOT use if you have an allergy to chlorhexidine/CHG or antibacterial soaps. If your skin becomes reddened or irritated, stop using the CHG and notify one of our RNs at [phone].   Please shower with the CHG soap starting 4 days before surgery using the following schedule:     Please keep in mind the following:  DO NOT shave, including legs and underarms,  starting the day of your first shower.   You may shave your face at any point before/day of surgery.  Place clean sheets on your bed the day you start using CHG soap. Use a clean washcloth (not used since being washed) for each shower. DO NOT sleep with pets once you start using the CHG.   CHG Shower Instructions:  If you choose to wash your hair and private area, wash first with your normal shampoo/soap.  After you use shampoo/soap, rinse your hair and body thoroughly to remove shampoo/soap residue.  Turn the water OFF and apply about 3 tablespoons  (45 ml) of CHG soap to a CLEAN washcloth.  Apply CHG soap ONLY FROM YOUR NECK DOWN TO YOUR TOES (washing for 3-5 minutes)  DO NOT use CHG soap on face, private areas, open wounds, or sores.  Pay special attention to the area where your surgery is being performed.  If you are having back surgery, having someone wash your back for you may be helpful. Wait 2 minutes after CHG soap is applied, then you may rinse off the CHG soap.  Pat dry with a clean towel  Put on clean clothes/pajamas   If you choose to wear lotion, please use ONLY the CHG-compatible lotions on the back of this paper.   Additional instructions for the day of surgery: DO NOT APPLY any lotions, deodorants, cologne, or perfumes.   Do not bring valuables to the hospital. [facility] is not responsible for any belongings/valuables. Do not wear nail polish, gel polish, artificial nails, or any other type of covering on natural nails (fingers and toes) Do not wear jewelry or makeup Put on clean/comfortable clothes.  Please brush your teeth.  Ask your nurse before applying any prescription medications to the skin.     CHG Compatible Lotions   Aveeno Moisturizing lotion  Cetaphil Moisturizing Cream  Cetaphil Moisturizing Lotion  Clairol Herbal Essence Moisturizing Lotion, Dry Skin  Clairol Herbal Essence Moisturizing Lotion, Extra Dry Skin  Clairol Herbal Essence Moisturizing Lotion, Normal Skin  Curel Age Defying Therapeutic Moisturizing Lotion with Alpha Hydroxy  Curel Extreme Care Body Lotion  Curel Soothing Hands Moisturizing Hand Lotion  Curel Therapeutic Moisturizing Cream, Fragrance-Free  Curel Therapeutic Moisturizing Lotion, Fragrance-Free  Curel Therapeutic Moisturizing Lotion, Original Formula  Eucerin Daily Replenishing Lotion  Eucerin Dry Skin Therapy Plus Alpha Hydroxy Crme  Eucerin Dry Skin Therapy Plus Alpha Hydroxy Lotion  Eucerin Original Crme  Eucerin Original Lotion  Eucerin Plus Crme Eucerin Plus  Lotion  Eucerin TriLipid Replenishing Lotion  Keri Anti-Bacterial Hand Lotion  Keri Deep Conditioning Original Lotion Dry Skin Formula Softly Scented  Keri Deep Conditioning Original Lotion, Fragrance Free Sensitive Skin Formula  Keri Lotion Fast Absorbing Fragrance Free Sensitive Skin Formula  Keri Lotion Fast Absorbing Softly Scented Dry Skin Formula  Keri Original Lotion  Keri Skin Renewal Lotion Keri Silky Smooth Lotion  Keri Silky Smooth Sensitive Skin Lotion  Nivea Body Creamy Conditioning Oil  Nivea Body Extra Enriched Lotion  Nivea Body Original Lotion  Nivea Body Sheer Moisturizing Lotion Nivea Crme  Nivea Skin Firming Lotion  NutraDerm 30 Skin Lotion  NutraDerm Skin Lotion  NutraDerm Therapeutic Skin Cream  NutraDerm Therapeutic Skin Lotion  ProShield Protective Hand Cream  Provon moisturizing lotion  Please read over the following fact sheets that you were given.

## 2023-03-24 ENCOUNTER — Inpatient Hospital Stay (HOSPITAL_COMMUNITY)
Admission: RE | Admit: 2023-03-24 | Discharge: 2023-03-24 | Disposition: A | Discharge status: Home / Self Care | Payer: PPO | Source: Ambulatory Visit

## 2023-03-24 HISTORY — DX: Essential (primary) hypertension: I10

## 2023-03-30 ENCOUNTER — Encounter (HOSPITAL_COMMUNITY)
Admission: RE | Admit: 2023-03-30 | Discharge: 2023-03-30 | Disposition: A | Discharge status: Home / Self Care | Payer: PPO | Source: Ambulatory Visit | Attending: Neurological Surgery | Admitting: Neurological Surgery

## 2023-03-30 ENCOUNTER — Other Ambulatory Visit: Payer: Self-pay

## 2023-03-30 ENCOUNTER — Encounter (HOSPITAL_COMMUNITY): Payer: Self-pay

## 2023-03-30 VITALS — BP 152/81 | HR 61 | Temp 97.9°F | Resp 18 | Ht 73.0 in | Wt 209.0 lb

## 2023-03-30 DIAGNOSIS — I10 Essential (primary) hypertension: Secondary | ICD-10-CM | POA: Diagnosis not present

## 2023-03-30 DIAGNOSIS — Z01818 Encounter for other preprocedural examination: Secondary | ICD-10-CM | POA: Diagnosis not present

## 2023-03-30 HISTORY — DX: Personal history of urinary calculi: Z87.442

## 2023-03-30 LAB — SURGICAL PCR SCREEN
MRSA, PCR: NEGATIVE
Staphylococcus aureus: NEGATIVE

## 2023-03-30 LAB — CBC
HCT: 42.1 % (ref 39.0–52.0)
Hemoglobin: 14.7 g/dL (ref 13.0–17.0)
MCH: 30.9 pg (ref 26.0–34.0)
MCHC: 34.9 g/dL (ref 30.0–36.0)
MCV: 88.6 fL (ref 80.0–100.0)
Platelets: 204 10*3/uL (ref 150–400)
RBC: 4.75 MIL/uL (ref 4.22–5.81)
RDW: 12.6 % (ref 11.5–15.5)
WBC: 5.6 10*3/uL (ref 4.0–10.5)
nRBC: 0 % (ref 0.0–0.2)

## 2023-03-30 LAB — BASIC METABOLIC PANEL
Anion gap: 7 (ref 5–15)
BUN: 7 mg/dL — ABNORMAL LOW (ref 8–23)
CO2: 27 mmol/L (ref 22–32)
Calcium: 8.7 mg/dL — ABNORMAL LOW (ref 8.9–10.3)
Chloride: 106 mmol/L (ref 98–111)
Creatinine, Ser: 0.77 mg/dL (ref 0.61–1.24)
GFR, Estimated: >60 mL/min (ref >60)
Glucose, Bld: 100 mg/dL — ABNORMAL HIGH (ref 70–99)
Potassium: 3.6 mmol/L (ref 3.5–5.1)
Sodium: 140 mmol/L (ref 135–145)

## 2023-03-30 NOTE — Progress Notes (Signed)
PCP - Gaye Alken, NP Cardiologist - denies  PPM/ICD - denies   Chest x-ray - denies EKG - 03/30/23 Stress Test - 10-15 years ago per pt, at [facility], normal per pt ECHO - denies Cardiac Cath - denies  Sleep Study - denies   DM- denies  ASA/Blood Thinner Instructions: n/a  ERAS Protcol - no, NPO   COVID TEST- n/a   Anesthesia review: no  Patient denies shortness of breath, fever, cough and chest pain at PAT appointment   All instructions explained to the patient, with a verbal understanding of the material. Patient agrees to go over the instructions while at home for a better understanding.  The opportunity to ask questions was provided.

## 2023-03-31 MED ORDER — CHLORHEXIDINE GLUCONATE 0.12 % MT SOLN
OROMUCOSAL | Status: AC
  Filled 2023-03-31: qty 15

## 2023-03-31 MED ORDER — CEFAZOLIN SODIUM-DEXTROSE 2-4 GM/100ML-% IV SOLN
INTRAVENOUS | Status: AC
  Filled 2023-03-31: qty 100

## 2023-04-05 NOTE — Progress Notes (Signed)
Updated patient with new date and time of arrival for surgery.  04/06/2023 at 1215.  No further questions at this time.

## 2023-04-06 ENCOUNTER — Ambulatory Visit (HOSPITAL_COMMUNITY): Payer: PPO | Admitting: Anesthesiology

## 2023-04-06 ENCOUNTER — Observation Stay (HOSPITAL_COMMUNITY)
Admission: RE | Admit: 2023-04-06 | Discharge: 2023-04-07 | Disposition: A | Discharge status: Home / Self Care | Payer: PPO | Attending: Neurological Surgery | Admitting: Neurological Surgery

## 2023-04-06 ENCOUNTER — Ambulatory Visit (HOSPITAL_COMMUNITY): Payer: PPO

## 2023-04-06 ENCOUNTER — Other Ambulatory Visit: Payer: Self-pay

## 2023-04-06 ENCOUNTER — Encounter (HOSPITAL_COMMUNITY): Payer: Self-pay | Admitting: Neurological Surgery

## 2023-04-06 ENCOUNTER — Encounter (HOSPITAL_COMMUNITY)
Admission: RE | Disposition: A | Discharge status: Home / Self Care | Payer: Self-pay | Source: Home / Self Care | Attending: Neurological Surgery

## 2023-04-06 DIAGNOSIS — M48062 Spinal stenosis, lumbar region with neurogenic claudication: Principal | ICD-10-CM | POA: Insufficient documentation

## 2023-04-06 DIAGNOSIS — M5416 Radiculopathy, lumbar region: Secondary | ICD-10-CM | POA: Diagnosis not present

## 2023-04-06 DIAGNOSIS — Z79899 Other long term (current) drug therapy: Secondary | ICD-10-CM | POA: Diagnosis not present

## 2023-04-06 DIAGNOSIS — Z0189 Encounter for other specified special examinations: Secondary | ICD-10-CM | POA: Diagnosis not present

## 2023-04-06 DIAGNOSIS — Z87891 Personal history of nicotine dependence: Secondary | ICD-10-CM | POA: Insufficient documentation

## 2023-04-06 DIAGNOSIS — M7138 Other bursal cyst, other site: Secondary | ICD-10-CM | POA: Insufficient documentation

## 2023-04-06 DIAGNOSIS — I10 Essential (primary) hypertension: Secondary | ICD-10-CM | POA: Diagnosis not present

## 2023-04-06 DIAGNOSIS — M4802 Spinal stenosis, cervical region: Secondary | ICD-10-CM | POA: Diagnosis not present

## 2023-04-06 DIAGNOSIS — M48061 Spinal stenosis, lumbar region without neurogenic claudication: Principal | ICD-10-CM | POA: Diagnosis present

## 2023-04-06 HISTORY — PX: LUMBAR LAMINECTOMY/DECOMPRESSION MICRODISCECTOMY: SHX5026

## 2023-04-06 SURGERY — LUMBAR LAMINECTOMY/DECOMPRESSION MICRODISCECTOMY 1 LEVEL
Anesthesia: General

## 2023-04-06 MED ORDER — OXYCODONE HCL 5 MG/5ML PO SOLN: 5.0000 mg | Freq: Once | ORAL | Status: AC | PRN

## 2023-04-06 MED ORDER — FENTANYL CITRATE (PF) 100 MCG/2ML IJ SOLN
INTRAMUSCULAR | Status: AC
  Filled 2023-04-06: qty 2

## 2023-04-06 MED ORDER — CHLORHEXIDINE GLUCONATE CLOTH 2 % EX PADS: 6.0000 | MEDICATED_PAD | Freq: Once | CUTANEOUS | Status: DC

## 2023-04-06 MED ORDER — DEXAMETHASONE SODIUM PHOSPHATE 10 MG/ML IJ SOLN
INTRAMUSCULAR | Status: DC | PRN
  Administered 2023-04-06: 10 mg via INTRAVENOUS

## 2023-04-06 MED ORDER — ORAL CARE MOUTH RINSE: 15.0000 mL | Freq: Once | OROMUCOSAL | Status: AC

## 2023-04-06 MED ORDER — LACTATED RINGERS IV SOLN: INTRAVENOUS | Status: DC

## 2023-04-06 MED ORDER — MICROFIBRILLAR COLL HEMOSTAT EX POWD
CUTANEOUS | Status: AC
  Filled 2023-04-06: qty 5

## 2023-04-06 MED ORDER — SODIUM CHLORIDE 0.9 % IV SOLN: 250.0000 mL | INTRAVENOUS | Status: DC

## 2023-04-06 MED ORDER — ONDANSETRON HCL 4 MG PO TABS: 4.0000 mg | ORAL_TABLET | Freq: Four times a day (QID) | ORAL | Status: DC | PRN

## 2023-04-06 MED ORDER — MENTHOL 3 MG MT LOZG
1.0000 | LOZENGE | OROMUCOSAL | Status: DC | PRN
  Filled 2023-04-06: qty 9

## 2023-04-06 MED ORDER — ALBUTEROL SULFATE (2.5 MG/3ML) 0.083% IN NEBU: 3.0000 mL | INHALATION_SOLUTION | Freq: Four times a day (QID) | RESPIRATORY_TRACT | Status: DC | PRN

## 2023-04-06 MED ORDER — ONDANSETRON HCL 4 MG/2ML IJ SOLN
INTRAMUSCULAR | Status: AC
  Filled 2023-04-06: qty 2

## 2023-04-06 MED ORDER — CHLORHEXIDINE GLUCONATE 0.12 % MT SOLN
15.0000 mL | Freq: Once | OROMUCOSAL | Status: AC
  Administered 2023-04-06: 15 mL via OROMUCOSAL
  Filled 2023-04-06: qty 15

## 2023-04-06 MED ORDER — PROMETHAZINE HCL 25 MG/ML IJ SOLN: 6.2500 mg | INTRAMUSCULAR | Status: DC | PRN

## 2023-04-06 MED ORDER — THROMBIN 5000 UNITS EX SOLR
CUTANEOUS | Status: AC
  Filled 2023-04-06: qty 5000

## 2023-04-06 MED ORDER — ONDANSETRON HCL 4 MG/2ML IJ SOLN
INTRAMUSCULAR | Status: DC | PRN
  Administered 2023-04-06: 4 mg via INTRAVENOUS

## 2023-04-06 MED ORDER — GABAPENTIN 400 MG PO CAPS
1200.0000 mg | ORAL_CAPSULE | Freq: Every day | ORAL | Status: DC
  Administered 2023-04-06: 1200 mg via ORAL
  Filled 2023-04-06: qty 3

## 2023-04-06 MED ORDER — BUPIVACAINE LIPOSOME 1.3 % IJ SUSP
INTRAMUSCULAR | Status: AC
  Filled 2023-04-06: qty 20

## 2023-04-06 MED ORDER — SODIUM CHLORIDE 0.9% FLUSH
3.0000 mL | Freq: Two times a day (BID) | INTRAVENOUS | Status: DC
  Administered 2023-04-06: 3 mL via INTRAVENOUS

## 2023-04-06 MED ORDER — HYDROMORPHONE HCL 1 MG/ML IJ SOLN: 0.5000 mg | INTRAMUSCULAR | Status: DC | PRN

## 2023-04-06 MED ORDER — KETOROLAC TROMETHAMINE 15 MG/ML IJ SOLN
7.5000 mg | Freq: Four times a day (QID) | INTRAMUSCULAR | Status: DC
  Administered 2023-04-06 – 2023-04-07 (×2): 7.5 mg via INTRAVENOUS
  Filled 2023-04-06 (×2): qty 1

## 2023-04-06 MED ORDER — LIDOCAINE 2% (20 MG/ML) 5 ML SYRINGE
INTRAMUSCULAR | Status: AC
  Filled 2023-04-06: qty 5

## 2023-04-06 MED ORDER — CLONAZEPAM 0.5 MG PO TABS
0.5000 mg | ORAL_TABLET | Freq: Every evening | ORAL | Status: DC | PRN
  Administered 2023-04-06: 1 mg via ORAL
  Filled 2023-04-06: qty 2

## 2023-04-06 MED ORDER — OXYCODONE HCL 5 MG PO TABS
10.0000 mg | ORAL_TABLET | ORAL | Status: DC | PRN
  Administered 2023-04-06 – 2023-04-07 (×2): 10 mg via ORAL
  Filled 2023-04-06 (×2): qty 2

## 2023-04-06 MED ORDER — ACETAMINOPHEN 650 MG RE SUPP: 650.0000 mg | RECTAL | Status: DC | PRN

## 2023-04-06 MED ORDER — VANCOMYCIN HCL IN DEXTROSE 1-5 GM/200ML-% IV SOLN
1000.0000 mg | INTRAVENOUS | Status: AC
  Administered 2023-04-06: 1000 mg via INTRAVENOUS
  Filled 2023-04-06: qty 200

## 2023-04-06 MED ORDER — METHOCARBAMOL 1000 MG/10ML IJ SOLN: 500.0000 mg | Freq: Four times a day (QID) | INTRAVENOUS | Status: DC | PRN

## 2023-04-06 MED ORDER — GABAPENTIN 300 MG PO CAPS: 600.0000 mg | ORAL_CAPSULE | Freq: Every day | ORAL | Status: DC

## 2023-04-06 MED ORDER — METHYLPREDNISOLONE ACETATE 80 MG/ML IJ SUSP
INTRAMUSCULAR | Status: AC
  Filled 2023-04-06: qty 1

## 2023-04-06 MED ORDER — MIDAZOLAM HCL 2 MG/2ML IJ SOLN: 0.5000 mg | Freq: Once | INTRAMUSCULAR | Status: DC | PRN

## 2023-04-06 MED ORDER — 0.9 % SODIUM CHLORIDE (POUR BTL) OPTIME
TOPICAL | Status: DC | PRN
  Administered 2023-04-06: 1000 mL

## 2023-04-06 MED ORDER — METHYLPREDNISOLONE ACETATE 80 MG/ML IJ SUSP
INTRAMUSCULAR | Status: DC | PRN
  Administered 2023-04-06: 40 mg

## 2023-04-06 MED ORDER — ROCURONIUM BROMIDE 10 MG/ML (PF) SYRINGE
PREFILLED_SYRINGE | INTRAVENOUS | Status: AC
  Filled 2023-04-06: qty 10

## 2023-04-06 MED ORDER — FLEET ENEMA 7-19 GM/118ML RE ENEM: 1.0000 | ENEMA | Freq: Once | RECTAL | Status: DC | PRN

## 2023-04-06 MED ORDER — BUPIVACAINE-EPINEPHRINE (PF) 0.5% -1:200000 IJ SOLN
INTRAMUSCULAR | Status: DC | PRN
  Administered 2023-04-06: 5 mL

## 2023-04-06 MED ORDER — MEPERIDINE HCL 25 MG/ML IJ SOLN: 6.2500 mg | INTRAMUSCULAR | Status: DC | PRN

## 2023-04-06 MED ORDER — MICROFIBRILLAR COLL HEMOSTAT EX POWD
CUTANEOUS | Status: DC | PRN
  Administered 2023-04-06: 1 g via TOPICAL

## 2023-04-06 MED ORDER — THROMBIN 5000 UNITS EX SOLR
OROMUCOSAL | Status: DC | PRN
  Administered 2023-04-06: 5 mL via TOPICAL

## 2023-04-06 MED ORDER — BUPIVACAINE LIPOSOME 1.3 % IJ SUSP
INTRAMUSCULAR | Status: DC | PRN
  Administered 2023-04-06: 20 mL

## 2023-04-06 MED ORDER — VENLAFAXINE HCL ER 75 MG PO CP24
75.0000 mg | ORAL_CAPSULE | Freq: Every day | ORAL | Status: DC
  Administered 2023-04-07: 75 mg via ORAL
  Filled 2023-04-06: qty 1

## 2023-04-06 MED ORDER — PROPOFOL 10 MG/ML IV BOLUS
INTRAVENOUS | Status: DC | PRN
Start: 2023-04-06 — End: 2023-04-06
  Administered 2023-04-06: 80 mg via INTRAVENOUS

## 2023-04-06 MED ORDER — GABAPENTIN 600 MG PO TABS: 600.0000 mg | ORAL_TABLET | ORAL | Status: DC

## 2023-04-06 MED ORDER — PROPOFOL 10 MG/ML IV BOLUS
INTRAVENOUS | Status: AC
  Filled 2023-04-06: qty 20

## 2023-04-06 MED ORDER — PHENOL 1.4 % MT LIQD: 1.0000 | OROMUCOSAL | Status: DC | PRN

## 2023-04-06 MED ORDER — METHOCARBAMOL 500 MG PO TABS
500.0000 mg | ORAL_TABLET | Freq: Four times a day (QID) | ORAL | Status: DC | PRN
  Administered 2023-04-06 – 2023-04-07 (×2): 500 mg via ORAL
  Filled 2023-04-06 (×3): qty 1

## 2023-04-06 MED ORDER — FENTANYL CITRATE (PF) 100 MCG/2ML IJ SOLN
INTRAMUSCULAR | Status: DC | PRN
  Administered 2023-04-06: 50 ug via INTRAVENOUS

## 2023-04-06 MED ORDER — ROCURONIUM BROMIDE 10 MG/ML (PF) SYRINGE
PREFILLED_SYRINGE | INTRAVENOUS | Status: DC | PRN
  Administered 2023-04-06: 60 mg via INTRAVENOUS
  Administered 2023-04-06: 10 mg via INTRAVENOUS

## 2023-04-06 MED ORDER — FENTANYL CITRATE (PF) 250 MCG/5ML IJ SOLN
INTRAMUSCULAR | Status: DC | PRN
  Administered 2023-04-06: 150 ug via INTRAVENOUS
  Administered 2023-04-06 (×2): 50 ug via INTRAVENOUS

## 2023-04-06 MED ORDER — LIDOCAINE-EPINEPHRINE 1 %-1:100000 IJ SOLN
INTRAMUSCULAR | Status: AC
  Filled 2023-04-06: qty 1

## 2023-04-06 MED ORDER — SUGAMMADEX SODIUM 200 MG/2ML IV SOLN
INTRAVENOUS | Status: DC | PRN
  Administered 2023-04-06: 200 mg via INTRAVENOUS

## 2023-04-06 MED ORDER — VANCOMYCIN HCL IN DEXTROSE 1-5 GM/200ML-% IV SOLN
1000.0000 mg | Freq: Once | INTRAVENOUS | Status: AC
  Administered 2023-04-07: 1000 mg via INTRAVENOUS
  Filled 2023-04-06: qty 200

## 2023-04-06 MED ORDER — LIDOCAINE 2% (20 MG/ML) 5 ML SYRINGE
INTRAMUSCULAR | Status: DC | PRN
  Administered 2023-04-06: 40 mg via INTRAVENOUS

## 2023-04-06 MED ORDER — LOSARTAN POTASSIUM 25 MG PO TABS
25.0000 mg | ORAL_TABLET | Freq: Every day | ORAL | Status: DC
  Administered 2023-04-06: 25 mg via ORAL
  Filled 2023-04-06 (×2): qty 1

## 2023-04-06 MED ORDER — OXYCODONE HCL 5 MG PO TABS
5.0000 mg | ORAL_TABLET | Freq: Once | ORAL | Status: AC | PRN
  Administered 2023-04-06: 5 mg via ORAL

## 2023-04-06 MED ORDER — HYDROMORPHONE HCL 1 MG/ML IJ SOLN: 0.2500 mg | INTRAMUSCULAR | Status: DC | PRN

## 2023-04-06 MED ORDER — OXYCODONE HCL 5 MG PO TABS: 5.0000 mg | ORAL_TABLET | ORAL | Status: DC | PRN

## 2023-04-06 MED ORDER — ACETAMINOPHEN 500 MG PO TABS
1000.0000 mg | ORAL_TABLET | Freq: Once | ORAL | Status: AC
  Administered 2023-04-06: 1000 mg via ORAL
  Filled 2023-04-06: qty 2

## 2023-04-06 MED ORDER — LIDOCAINE-EPINEPHRINE 1 %-1:100000 IJ SOLN
INTRAMUSCULAR | Status: DC | PRN
  Administered 2023-04-06: 5 mL

## 2023-04-06 MED ORDER — SODIUM CHLORIDE 0.9% FLUSH: 3.0000 mL | INTRAVENOUS | Status: DC | PRN

## 2023-04-06 MED ORDER — POLYETHYLENE GLYCOL 3350 17 G PO PACK: 17.0000 g | PACK | Freq: Every day | ORAL | Status: DC | PRN

## 2023-04-06 MED ORDER — PRAVASTATIN SODIUM 40 MG PO TABS
40.0000 mg | ORAL_TABLET | Freq: Every day | ORAL | Status: DC
  Administered 2023-04-06: 40 mg via ORAL
  Filled 2023-04-06: qty 1

## 2023-04-06 MED ORDER — FENTANYL CITRATE (PF) 250 MCG/5ML IJ SOLN
INTRAMUSCULAR | Status: AC
  Filled 2023-04-06: qty 5

## 2023-04-06 MED ORDER — DEXAMETHASONE SODIUM PHOSPHATE 10 MG/ML IJ SOLN
INTRAMUSCULAR | Status: AC
  Filled 2023-04-06: qty 1

## 2023-04-06 MED ORDER — BUPIVACAINE-EPINEPHRINE (PF) 0.5% -1:200000 IJ SOLN
INTRAMUSCULAR | Status: AC
  Filled 2023-04-06: qty 30

## 2023-04-06 MED ORDER — ONDANSETRON HCL 4 MG/2ML IJ SOLN
4.0000 mg | Freq: Four times a day (QID) | INTRAMUSCULAR | Status: DC | PRN
  Administered 2023-04-06: 4 mg via INTRAVENOUS
  Filled 2023-04-06: qty 2

## 2023-04-06 MED ORDER — OXYCODONE HCL 5 MG PO TABS
ORAL_TABLET | ORAL | Status: AC
  Filled 2023-04-06: qty 1

## 2023-04-06 MED ORDER — DOCUSATE SODIUM 100 MG PO CAPS
100.0000 mg | ORAL_CAPSULE | Freq: Two times a day (BID) | ORAL | Status: DC
  Administered 2023-04-06: 100 mg via ORAL
  Filled 2023-04-06: qty 1

## 2023-04-06 MED ORDER — PANTOPRAZOLE SODIUM 40 MG PO TBEC
40.0000 mg | DELAYED_RELEASE_TABLET | Freq: Every day | ORAL | Status: DC
  Administered 2023-04-07: 40 mg via ORAL
  Filled 2023-04-06: qty 1

## 2023-04-06 MED ORDER — ACETAMINOPHEN 325 MG PO TABS
650.0000 mg | ORAL_TABLET | ORAL | Status: DC | PRN
  Administered 2023-04-07: 650 mg via ORAL
  Filled 2023-04-06: qty 2

## 2023-04-06 SURGICAL SUPPLY — 59 items
ADH SKN CLS APL DERMABOND .7 (GAUZE/BANDAGES/DRESSINGS) ×1
BAG COUNTER SPONGE SURGICOUNT (BAG) ×1 IMPLANT
BAG SPNG CNTER NS LX DISP (BAG) ×1
BUR CARBIDE MATCH 3.0 (BURR) ×1 IMPLANT
CNTNR URN SCR LID CUP LEK RST (MISCELLANEOUS) ×1 IMPLANT
CONT SPEC 4OZ STRL OR WHT (MISCELLANEOUS) ×1
COVER MAYO STAND STRL (DRAPES) ×1 IMPLANT
DERMABOND ADVANCED .7 DNX12 (GAUZE/BANDAGES/DRESSINGS) IMPLANT
DRAIN JACKSON RD 7FR 3/32 (WOUND CARE) IMPLANT
DRAPE C-ARM 42X72 X-RAY (DRAPES) ×1 IMPLANT
DRAPE LAPAROTOMY 100X72X124 (DRAPES) ×1 IMPLANT
DRAPE MICROSCOPE SLANT 54X150 (MISCELLANEOUS) ×1 IMPLANT
DRAPE SURG 17X23 STRL (DRAPES) ×1 IMPLANT
DRSG OPSITE POSTOP 4X6 (GAUZE/BANDAGES/DRESSINGS) IMPLANT
DURAPREP 26ML APPLICATOR (WOUND CARE) ×1 IMPLANT
ELECT BLADE INSULATED 4IN (ELECTROSURGICAL) ×1
ELECT COATED BLADE 2.86 ST (ELECTRODE) ×1 IMPLANT
ELECT REM PT RETURN 9FT ADLT (ELECTROSURGICAL) ×1
ELECTRODE BLADE INSULATED 4IN (ELECTROSURGICAL) ×1 IMPLANT
ELECTRODE REM PT RTRN 9FT ADLT (ELECTROSURGICAL) ×1 IMPLANT
EVACUATOR 1/8 PVC DRAIN (DRAIN) IMPLANT
GAUZE 4X4 16PLY ~~LOC~~+RFID DBL (SPONGE) IMPLANT
GAUZE SPONGE 4X4 12PLY STRL (GAUZE/BANDAGES/DRESSINGS) ×1 IMPLANT
GLOVE BIO SURGEON STRL SZ7 (GLOVE) ×1 IMPLANT
GLOVE BIOGEL PI IND STRL 7.5 (GLOVE) ×1 IMPLANT
GLOVE BIOGEL PI IND STRL 8 (GLOVE) ×1 IMPLANT
GLOVE ECLIPSE 8.0 STRL XLNG CF (GLOVE) ×2 IMPLANT
GOWN STRL REUS W/ TWL LRG LVL3 (GOWN DISPOSABLE) IMPLANT
GOWN STRL REUS W/ TWL XL LVL3 (GOWN DISPOSABLE) ×2 IMPLANT
GOWN STRL REUS W/TWL 2XL LVL3 (GOWN DISPOSABLE) IMPLANT
GOWN STRL REUS W/TWL LRG LVL3 (GOWN DISPOSABLE)
GOWN STRL REUS W/TWL XL LVL3 (GOWN DISPOSABLE) ×2
HEMOSTAT POWDER KIT SURGIFOAM (HEMOSTASIS) ×1 IMPLANT
KIT BASIN OR (CUSTOM PROCEDURE TRAY) ×1 IMPLANT
KIT POSITION SURG JACKSON T1 (MISCELLANEOUS) ×1 IMPLANT
KIT TURNOVER KIT B (KITS) ×1 IMPLANT
MARKER SKIN DUAL TIP RULER LAB (MISCELLANEOUS) ×1 IMPLANT
NDL HYPO 21X1.5 SAFETY (NEEDLE) IMPLANT
NDL HYPO 25X1 1.5 SAFETY (NEEDLE) ×1 IMPLANT
NEEDLE HYPO 21X1.5 SAFETY (NEEDLE) ×1 IMPLANT
NEEDLE HYPO 25X1 1.5 SAFETY (NEEDLE) ×1 IMPLANT
NS IRRIG 1000ML POUR BTL (IV SOLUTION) ×1 IMPLANT
PACK LAMINECTOMY NEURO (CUSTOM PROCEDURE TRAY) ×1 IMPLANT
PAD ARMBOARD 7.5X6 YLW CONV (MISCELLANEOUS) ×3 IMPLANT
PATTIES SURGICAL .5 X.5 (GAUZE/BANDAGES/DRESSINGS) IMPLANT
PATTIES SURGICAL .5 X1 (DISPOSABLE) IMPLANT
PATTIES SURGICAL 1X1 (DISPOSABLE) IMPLANT
SPIKE FLUID TRANSFER (MISCELLANEOUS) ×1 IMPLANT
SPONGE SURGIFOAM ABS GEL SZ50 (HEMOSTASIS) ×1 IMPLANT
SPONGE T-LAP 4X18 ~~LOC~~+RFID (SPONGE) IMPLANT
STAPLER VISISTAT 35W (STAPLE) IMPLANT
SUT VIC AB 0 CT1 18XCR BRD8 (SUTURE) ×1 IMPLANT
SUT VIC AB 0 CT1 8-18 (SUTURE) ×1
SUT VIC AB 2-0 CP2 18 (SUTURE) ×1 IMPLANT
SUT VIC AB 3-0 SH 8-18 (SUTURE) ×1 IMPLANT
SYR 20CC LL (SYRINGE) IMPLANT
TOWEL GREEN STERILE (TOWEL DISPOSABLE) ×1 IMPLANT
TOWEL GREEN STERILE FF (TOWEL DISPOSABLE) ×1 IMPLANT
WATER STERILE IRR 1000ML POUR (IV SOLUTION) ×1 IMPLANT
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
## 2023-04-06 NOTE — Op Note (Signed)
Providing Compassionate, Quality Care - Together   Date of service: 04/06/2023   PREOP DIAGNOSIS:  L2-3, L3-4 lumbar stenosis with neurogenic claudication, radiculopathy   POSTOP DIAGNOSIS: Same, left L3-4 lateral recess facet synovial cyst   PROCEDURE: Open bilateral L2, L3, L4 laminectomy for decompression neural elements Intraoperative use of microscope for microdissection Intraoperative use of fluoroscopy   SURGEON: Dr. Kendell Bane C. Danyetta Gillham, DO   ASSISTANT: Lisbeth Renshaw, MD; Patrici Ranks, PA   ANESTHESIA: General Endotracheal   EBL: 50 cc   SPECIMENS: None   DRAINS: none   COMPLICATIONS: none   CONDITION: Hemodynamically stable   HISTORY: Parker Lawrence is a 72 y.o. male who presented with neurogenic claudication and right lower extremity radiculopathy.  MRI revealed multifactorial stenosis at L2-3 and L3-4 that was moderate to severe.  He failed conservative measures therefore I offered him an open laminectomy L2-3, L3-4 for decompression.  We discussed all risks, benefits and expected outcomes as well as alternatives to treatment.  Informed consent was obtained and witnessed.   PROCEDURE IN DETAIL: The patient was brought to the operating room. After induction of general anesthesia, the patient was positioned on the operative table in the prone position. All pressure points were meticulously padded. Skin incision was then marked out and prepped and draped in the usual sterile fashion.   Using a 10 blade, midline incision was created over the L2, L3, L4 spinous processes.  Using Bovie electrocautery soft tissue dissection was performed down to the lumbodorsal fascia.  Subperiosteal dissection was performed bilaterally with Bovie electrocautery exposing the L2, L3, L4 lamina bilaterally.  Deep retractors placed in the wound.  Lateral fluoroscopy confirmed the appropriate level.  The microscope was sterilely draped and brought into the field.  Using Leksell rongeur, the  spinous process of L2, L3, L4 were removed down to the lamina.    Using a high-speed drill, the L2 lamina was removed bilaterally to the lateral recess down to the ligamentum flavum and superiorly up to the ligamentous attachment bilaterally.  Using high-speed drill, a partial bilateral facetectomy was performed down to the ligamentum flavum.  The superior portion of the L3 lamina was then removed to the epidural space with the high speed drill. The ligamentum flavum was then gently dissected from the epidural space and resected with a series of Kerrison rongeurs to the lateral recess bilaterally.  Using a ball-tipped probe, bilateral lateral recesses appeared decompressed.  The bilateral lateral recess were explored again with a ball-tipped probe and noted to be appropriately decompressed.  The thecal sac was pulsatile.  Bilateral foramen were also palpated with a ball-tipped probe and noted to be adequately decompressed.   Using a high-speed drill, the L3 lamina was removed bilaterally to the lateral recess down to the ligamentum flavum and superiorly up to the ligamentous attachment bilaterally.  Using high-speed drill, a partial bilateral facetectomy was performed down to the ligamentum flavum.  The superior portion of the L4 lamina was then removed to the epidural space with the high speed drill. The ligamentum flavum was then gently dissected from the epidural space and resected with a series of Kerrison rongeurs to the lateral recess bilaterally.  Along the left lateral recess, there was a synovial cyst quite adherent to the thecal sac.  This was gently dissected with micro curettes.  This was removed with Kerrison rongeurs.  Using a ball-tipped probe, bilateral lateral recesses appeared decompressed.  The bilateral lateral recess were explored again with a ball-tipped probe and  noted to be appropriately decompressed.  The thecal sac was pulsatile.  Bilateral foramen were also palpated with a ball-tipped  probe and noted to be adequately decompressed.   Epidural space was copiously irrigated and hemostasis was achieved with Surgifoam.  A mixture of Depo-Medrol and fentanyl was placed in the epidural space with Avitene. Deep retractor was taken out of the wound.  Hemostasis was achieved with bipolar cautery and the soft tissues.  The wound was closed in layers with 0 Vicryl sutures for muscle and fascia.  Dermis was closed with 2-0 and 3-0 Vicryl sutures.  Skin was closed with skin glue.  Sterile dressing was applied.   At the end of the case all sponge, needle, and instrument counts were correct. The patient was then transferred to the stretcher, extubated, and taken to the post-anesthesia care unit in stable hemodynamic condition.

## 2023-04-06 NOTE — Anesthesia Preprocedure Evaluation (Addendum)
Anesthesia Evaluation  Patient identified by MRN, date of birth, ID band Patient awake    Reviewed: Allergy & Precautions, NPO status , Patient's Chart, lab work & pertinent test results  History of Anesthesia Complications Negative for: history of anesthetic complications  Airway Mallampati: II  TM Distance: >3 FB Neck ROM: Full    Dental  (+) Edentulous Lower, Edentulous Upper   Pulmonary former smoker Patient denies lung issues   Pulmonary exam normal breath sounds clear to auscultation       Cardiovascular hypertension, Pt. on medications Normal cardiovascular exam Rhythm:Regular Rate:Normal     Neuro/Psych  Headaches PSYCHIATRIC DISORDERS  Depression    Chronic lumbar pain: spinal cord stim  Neuromuscular disease    GI/Hepatic Neg liver ROS,GERD  Medicated and Controlled,,  Endo/Other  negative endocrine ROS    Renal/GU negative Renal ROSH/o stones     Musculoskeletal  (+) Arthritis ,  Ambulates with a cane   Abdominal   Peds  Hematology negative hematology ROS (+)   Anesthesia Other Findings LUMBAR STENOSIS WITH NEUROGENIC CLAUDICATION  Reproductive/Obstetrics                             Anesthesia Physical Anesthesia Plan  ASA: 3  Anesthesia Plan: General   Post-op Pain Management: Tylenol PO (pre-op)* and Dilaudid IV   Induction: Intravenous  PONV Risk Score and Plan: 2 and Ondansetron, Dexamethasone and Treatment may vary due to age or medical condition  Airway Management Planned: Oral ETT  Additional Equipment: None  Intra-op Plan:   Post-operative Plan: Extubation in OR  Informed Consent: I have reviewed the patients History and Physical, chart, labs and discussed the procedure including the risks, benefits and alternatives for the proposed anesthesia with the patient or authorized representative who has indicated his/her understanding and acceptance.      Dental advisory given  Plan Discussed with: CRNA and Anesthesiologist  Anesthesia Plan Comments:         Anesthesia Quick Evaluation

## 2023-04-06 NOTE — Anesthesia Procedure Notes (Signed)
Procedure Name: Intubation Date/Time: 04/06/2023 3:51 PM  Performed by: Jairo Ben, MDPre-anesthesia Checklist: Patient identified, Emergency Drugs available, Suction available and Patient being monitored Patient Re-evaluated:Patient Re-evaluated prior to induction Oxygen Delivery Method: Circle system utilized Preoxygenation: Pre-oxygenation with 100% oxygen Induction Type: IV induction Ventilation: Mask ventilation without difficulty Laryngoscope Size: Mac and 3 Grade View: Grade I Tube type: Oral Tube size: 7.0 mm Number of attempts: 1 Airway Equipment and Method: Stylet and Oral airway Placement Confirmation: ETT inserted through vocal cords under direct vision, positive ETCO2 and breath sounds checked- equal and bilateral Secured at: 22 cm Tube secured with: Tape Dental Injury: Teeth and Oropharynx as per pre-operative assessment

## 2023-04-06 NOTE — Anesthesia Postprocedure Evaluation (Signed)
Anesthesia Post Note  Patient: Parker Lawrence  Procedure(s) Performed: OPEN LUMBAR LAMINECTOMY LUMBAR TWO-THREE, LUMBAR THREE-FOUR     Patient location during evaluation: PACU Anesthesia Type: General Level of consciousness: awake and alert, patient cooperative and oriented Pain management: pain level controlled (pain improving) Vital Signs Assessment: post-procedure vital signs reviewed and stable Respiratory status: spontaneous breathing, nonlabored ventilation and respiratory function stable Cardiovascular status: blood pressure returned to baseline and stable Postop Assessment: no apparent nausea or vomiting and patient able to bend at knees Anesthetic complications: no   No notable events documented.  Last Vitals:  Vitals:   04/06/23 1820 04/06/23 1830  BP: (!) 155/97 (!) 151/75  Pulse: 78 79  Resp: 12 12  Temp:    SpO2: 100% 95%    Last Pain:  Vitals:   04/06/23 1830  TempSrc:   PainSc: 8                  Arthor Gorter,E. Tavarus Poteete

## 2023-04-06 NOTE — Transfer of Care (Signed)
Immediate Anesthesia Transfer of Care Note  Patient: Parker Lawrence  Procedure(s) Performed: OPEN LUMBAR LAMINECTOMY LUMBAR TWO-THREE, LUMBAR THREE-FOUR  Patient Location: PACU  Anesthesia Type:General  Level of Consciousness: awake, alert , oriented, and patient cooperative  Airway & Oxygen Therapy: Patient Spontanous Breathing and Patient connected to face mask oxygen  Post-op Assessment: Report given to RN, Post -op Vital signs reviewed and stable, Patient moving all extremities X 4, and Patient able to stick tongue midline  Post vital signs: Reviewed and stable  Last Vitals:  Vitals Value Taken Time  BP 155/97 04/06/23 1820  Temp    Pulse 82 04/06/23 1825  Resp 17 04/06/23 1825  SpO2 97 % 04/06/23 1825  Vitals shown include unfiled device data.  Last Pain:  Vitals:   04/06/23 1303  TempSrc:   PainSc: 7       Patients Stated Pain Goal: 0 (04/06/23 1303)  Complications: No notable events documented.

## 2023-04-07 ENCOUNTER — Encounter (HOSPITAL_COMMUNITY): Payer: Self-pay | Admitting: Neurological Surgery

## 2023-04-07 DIAGNOSIS — M48062 Spinal stenosis, lumbar region with neurogenic claudication: Secondary | ICD-10-CM | POA: Diagnosis not present

## 2023-04-07 MED ORDER — METHOCARBAMOL 500 MG PO TABS: 500.0000 mg | ORAL_TABLET | Freq: Three times a day (TID) | ORAL | 1 refills | Status: AC | PRN

## 2023-04-07 MED ORDER — HYDROCODONE-ACETAMINOPHEN 5-325 MG PO TABS: 1.0000 | ORAL_TABLET | ORAL | 0 refills | Status: AC | PRN

## 2023-04-07 NOTE — Plan of Care (Signed)
Patient ID: Parker Lawrence, male   DOB: Jul 12, 1951, 72 y.o.   MRN: [id number]  Problem: Education: Goal: Ability to verbalize activity precautions or restrictions will improve Outcome: Adequate for Discharge Goal: Knowledge of the prescribed therapeutic regimen will improve Outcome: Adequate for Discharge Goal: Understanding of discharge needs will improve Outcome: Adequate for Discharge   Problem: Activity: Goal: Ability to avoid complications of mobility impairment will improve Outcome: Adequate for Discharge Goal: Ability to tolerate increased activity will improve Outcome: Adequate for Discharge Goal: Will remain free from falls Outcome: Adequate for Discharge   Problem: Bowel/Gastric: Goal: Gastrointestinal status for postoperative course will improve Outcome: Adequate for Discharge   Problem: Clinical Measurements: Goal: Ability to maintain clinical measurements within normal limits will improve Outcome: Adequate for Discharge Goal: Postoperative complications will be avoided or minimized Outcome: Adequate for Discharge Goal: Diagnostic test results will improve Outcome: Adequate for Discharge   Problem: Pain Management: Goal: Pain level will decrease Outcome: Adequate for Discharge   Problem: Skin Integrity: Goal: Will show signs of wound healing Outcome: Adequate for Discharge   Problem: Health Behavior/Discharge Planning: Goal: Identification of resources available to assist in meeting health care needs will improve Outcome: Adequate for Discharge   Problem: Bladder/Genitourinary: Goal: Urinary functional status for postoperative course will improve Outcome: Adequate for Discharge   Problem: Education: Goal: Knowledge of General Education information will improve Description: Including pain rating scale, medication(s)/side effects and non-pharmacologic comfort measures Outcome: Adequate for Discharge   Problem: Health Behavior/Discharge Planning: Goal:  Ability to manage health-related needs will improve Outcome: Adequate for Discharge   Problem: Clinical Measurements: Goal: Ability to maintain clinical measurements within normal limits will improve Outcome: Adequate for Discharge Goal: Will remain free from infection Outcome: Adequate for Discharge Goal: Diagnostic test results will improve Outcome: Adequate for Discharge Goal: Respiratory complications will improve Outcome: Adequate for Discharge Goal: Cardiovascular complication will be avoided Outcome: Adequate for Discharge   Problem: Activity: Goal: Risk for activity intolerance will decrease Outcome: Adequate for Discharge   Problem: Nutrition: Goal: Adequate nutrition will be maintained Outcome: Adequate for Discharge   Problem: Coping: Goal: Level of anxiety will decrease Outcome: Adequate for Discharge   Problem: Elimination: Goal: Will not experience complications related to bowel motility Outcome: Adequate for Discharge Goal: Will not experience complications related to urinary retention Outcome: Adequate for Discharge   Problem: Pain Managment: Goal: General experience of comfort will improve Outcome: Adequate for Discharge   Problem: Safety: Goal: Ability to remain free from injury will improve Outcome: Adequate for Discharge   Problem: Skin Integrity: Goal: Risk for impaired skin integrity will decrease Outcome: Adequate for Discharge    Lidia Collum, RN

## 2023-04-07 NOTE — Discharge Summary (Signed)
  Patient ID: Parker Lawrence MRN: [id number] DOB/AGE: March 07, 1951 72 y.o.  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 male who was admitted following an uncomplicated L2-3, L3-4 laminectomy. They were recovered in PACU and transferred to [facility]. Hospital course was uncomplicated. Pt stable for discharge today. Pt to f/u in office for routine post op visit. Pt is in agreement w/ plan.    Discharge Exam: Blood pressure 116/65, pulse 64, temperature 97.8 F (36.6 C), temperature source Oral, resp. rate 17, height 6\' 1"  (1.854 m), weight 90.7 kg, SpO2 95%. A&O x3 Speech fluent, appropriate Strength 5/5 x4.  SILTx4.  Dressing c/d/I.   Disposition: Discharge disposition: 01-Home or Self Care       Discharge Instructions     Incentive spirometry RT   Complete by: As directed       Allergies as of 04/07/2023       Reactions   Lyrica [pregabalin] Anaphylaxis   Throat swelling   Penicillins    Hallucinations        Medication List     TAKE these medications    acetaminophen 500 MG tablet Commonly known as: TYLENOL Take 1,000 mg by mouth every 6 (six) hours as needed for moderate pain or headache.   albuterol 108 (90 Base) MCG/ACT inhaler Commonly known as: VENTOLIN HFA Inhale 1-2 puffs into the lungs every 6 (six) hours as needed for wheezing or shortness of breath.   CENTRUM SILVER PO Take 1 tablet by mouth daily.   clonazePAM 1 MG tablet Commonly known as: KLONOPIN Take 0.5-1 mg by mouth at bedtime as needed for anxiety (sleep).   Dexilant 60 MG capsule Generic drug: dexlansoprazole Take 60 mg by mouth daily.   diphenhydrAMINE 25 mg capsule Commonly known as: BENADRYL Take 25 mg by mouth  every 6 (six) hours as needed for allergies.   gabapentin 600 MG tablet Commonly known as: NEURONTIN Take 600-1,200 mg by mouth See admin instructions. Take 600 mg by mouth in the morning and take 1200 mg at bedtime.   HYDROcodone-acetaminophen 5-325 MG tablet Commonly known as: NORCO/VICODIN Take 1 tablet by mouth every 4 (four) hours as needed for moderate pain.   losartan 25 MG tablet Commonly known as: COZAAR Take 25 mg by mouth daily.   methocarbamol 500 MG tablet Commonly known as: ROBAXIN Take 1 tablet (500 mg total) by mouth every 8 (eight) hours as needed for muscle spasms.   pravastatin 40 MG tablet Commonly known as: PRAVACHOL Take 40 mg by mouth at bedtime.   psyllium 58.6 % powder Commonly known as: METAMUCIL Take 1 packet by mouth daily.   venlafaxine XR 75 MG 24 hr capsule Commonly known as: EFFEXOR-XR Take 75 mg by mouth daily with breakfast.         Signed: Jonea Bukowski CAYLIN Radonna Bracher 04/07/2023, 9:34 AM

## 2023-04-07 NOTE — Progress Notes (Signed)
Patient ID: Parker Lawrence, male   DOB: February 26, 1951, 72 y.o.   MRN: [id number]  An After Visit Summary was printed and given to the patient.   Patient education given on medication changes and follow up appointments and the patient expresses understanding and acceptance of instructions.   Lidia Collum 04/07/2023 11:24 AM

## 2023-04-07 NOTE — Evaluation (Signed)
Occupational Therapy Evaluation Patient Details Name: Parker Lawrence MRN: [id number] DOB: November 07, 1950 Today's Date: 04/07/2023   History of Present Illness Parker Lawrence is a 72 yo male who underwent L2-4 laminectomy 8/6. PMHx: arthritis, depression, dyslipidemia, HTN, IBS, back surgeries, L foot drop   Clinical Impression   Parker Lawrence was evaluated s/p the above spine surgery. He is mod I at baseline, however he reports several falls. Upon evaluation pt was limited by surgical pain, back precautions, limited insight to safety and deficits, restricted ROM, poor balance, generalized weakness, baseline L foot drop and decreased activity tolerance. Overall he needed min A for transfers and short mobility, and min A for LB ADLs. Provided cues and education on spinal precautions and compensatory techniques throughout, handout provided and pt demonstrated fair recall during ADLs and mobility. Pt does not require further acute OT services. Recommend d/c home with support of family.         If plan is discharge home, recommend the following: A little help with walking and/or transfers;A little help with bathing/dressing/bathroom;Assist for transportation;Supervision due to cognitive status;Help with stairs or ramp for entrance;Assistance with cooking/housework    Functional Status Assessment  Patient has had a recent decline in their functional status and demonstrates the ability to make significant improvements in function in a reasonable and predictable amount of time.  Equipment Recommendations  None recommended by OT (pt has needed equipment)       Precautions / Restrictions Precautions Precautions: Fall;Back Precaution Booklet Issued: Yes (comment) Required Braces or Orthoses: Other Brace Other Brace: L AFO Restrictions Weight Bearing Restrictions: No      Mobility Bed Mobility Overal bed mobility: Needs Assistance Bed Mobility: Rolling, Sidelying to Sit Rolling:  Supervision Sidelying to sit: Supervision       General bed mobility comments: cues for log roll    Transfers Overall transfer level: Needs assistance Equipment used: 1 person hand held assist Transfers: Sit to/from Stand Sit to Stand: Min assist           General transfer comment: pt would benefit from RW      Balance Overall balance assessment: Needs assistance Sitting-balance support: Feet supported Sitting balance-Leahy Scale: Good     Standing balance support: Single extremity supported, During functional activity Standing balance-Leahy Scale: Poor                             ADL either performed or assessed with clinical judgement   ADL Overall ADL's : Needs assistance/impaired Eating/Feeding: Independent   Grooming: Set up;Sitting   Upper Body Bathing: Set up;Sitting   Lower Body Bathing: Minimal assistance;Sit to/from stand   Upper Body Dressing : Set up;Sitting   Lower Body Dressing: Minimal assistance;Sit to/from stand   Toilet Transfer: Moderate assistance;Ambulation   Toileting- Clothing Manipulation and Hygiene: Contact guard assist;Sitting/lateral lean       Functional mobility during ADLs: Minimal assistance;Cueing for safety General ADL Comments: cues needed for back precuations, min A needed for STS transfers, min Afor mobility without RW. Pt able to obtain figure four position for LB dressing     Vision Baseline Vision/History: 0 No visual deficits Vision Assessment?: No apparent visual deficits     Perception Perception: Not tested       Praxis Praxis: Not tested       Pertinent Vitals/Pain Pain Assessment Pain Assessment: Faces Faces Pain Scale: Hurts a little bit Pain Location: back Pain Descriptors / Indicators: Discomfort  Pain Intervention(s): Limited activity within patient's tolerance, Monitored during session     Extremity/Trunk Assessment Upper Extremity Assessment Upper Extremity Assessment:  Generalized weakness   Lower Extremity Assessment Lower Extremity Assessment: LLE deficits/detail;Defer to PT evaluation LLE Deficits / Details: L foot drop.   Cervical / Trunk Assessment Cervical / Trunk Assessment: Normal   Communication Communication Communication: No apparent difficulties   Cognition Arousal: Alert Behavior During Therapy: WFL for tasks assessed/performed Overall Cognitive Status: Within Functional Limits for tasks assessed                                 General Comments: limited insight to safety awareness     General Comments  VSS, wife present. dressing clean and dry            Home Living Family/patient expects to be discharged to:: Private residence Living Arrangements: Spouse/significant other Available Help at Discharge: Family Type of Home: House Home Access: Stairs to enter Secretary/administrator of Steps: 4 Entrance Stairs-Rails: Can reach both Home Layout: One level     Bathroom Shower/Tub: Chief Strategy Officer: Handicapped height     Home Equipment: Agricultural consultant (2 wheels);Rollator (4 wheels);Cane - single point;Cane - quad          Prior Functioning/Environment Prior Level of Function : Independent/Modified Independent             Mobility Comments: reports several falls, most occur in teh yard. Ambulates with a walking stick intermittently ADLs Comments: indep, reports doing yard work but has a lot of falls when working in the yard        OT Problem List: Decreased range of motion;Decreased activity tolerance;Impaired balance (sitting and/or standing);Decreased safety awareness;Decreased knowledge of use of DME or AE;Decreased knowledge of precautions;Pain         OT Goals(Current goals can be found in the care plan section) Acute Rehab OT Goals Patient Stated Goal: home OT Goal Formulation: With patient Time For Goal Achievement: 04/07/23 Potential to Achieve Goals: Good   AM-PAC  OT "6 Clicks" Daily Activity     Outcome Measure Help from another person eating meals?: None Help from another person taking care of personal grooming?: A Little Help from another person toileting, which includes using toliet, bedpan, or urinal?: A Little Help from another person bathing (including washing, rinsing, drying)?: A Little Help from another person to put on and taking off regular upper body clothing?: A Little Help from another person to put on and taking off regular lower body clothing?: A Little 6 Click Score: 19   End of Session Nurse Communication: Mobility status  Activity Tolerance: Patient tolerated treatment well Patient left: in bed;with call bell/phone within reach;with bed alarm set  OT Visit Diagnosis: Unsteadiness on feet (R26.81);Other abnormalities of gait and mobility (R26.89);Muscle weakness (generalized) (M62.81);Pain                Time: 1610-9604 OT Time Calculation (min): 22 min Charges:  OT General Charges $OT Visit: 1 Visit OT Evaluation $OT Eval Low Complexity: 1 Low  Derenda Mis, OTR/L Acute Rehabilitation Services Office [phone] Secure Chat Communication Preferred   Donia Pounds 04/07/2023, 9:43 AM

## 2023-04-07 NOTE — Care Management (Signed)
Patient with order to DC to home today. Unit staff to provide DME needed for home.   No HH needs identified Patient will have family/ friends provide transportation home. No other TOC needs identified for DC 

## 2023-04-07 NOTE — Evaluation (Addendum)
Physical Therapy Evaluation  Patient Details Name: Parker Lawrence MRN: [id number] DOB: 03/04/1951 Today's Date: 04/07/2023  History of Present Illness  Pt is a 71 y/o male who underwent L2-4 laminectomy 04/06/23. PMH significant for arthritis, depression, HTN, IBS, back surgeries, L foot drop.  Clinical Impression  Pt admitted with above diagnosis. At the time of PT eval, pt was able to demonstrate transfers and ambulation with gross CGA to min assist. Pt practiced with both the walking stick and RW. Improved steadiness with the RW, and pt agreed he felt more comfortable with the RW as well. Pt was educated on precautions, positioning recommendations, appropriate activity progression, and car transfer. Pt currently with functional limitations due to the deficits listed below (see PT Problem List). Pt will benefit from skilled PT to increase their independence and safety with mobility to allow discharge to the venue listed below.      Of note, pt reports lightheadedness/dizziness. BP 124/60 upon arrival sitting up EOB and 138/72 after ambulation.       If plan is discharge home, recommend the following: A little help with walking and/or transfers;A little help with bathing/dressing/bathroom;Assistance with cooking/housework;Assist for transportation;Help with stairs or ramp for entrance   Can travel by private vehicle        Equipment Recommendations None recommended by PT  Recommendations for Other Services       Functional Status Assessment Patient has had a recent decline in their functional status and demonstrates the ability to make significant improvements in function in a reasonable and predictable amount of time.     Precautions / Restrictions Precautions Precautions: Fall;Back Precaution Booklet Issued: Yes (comment) Precaution Comments: Reviewed handout and pt was cued for precautions during functional mobility. Required Braces or Orthoses: Other Brace Other Brace: L   AFO Restrictions Weight Bearing Restrictions: No      Mobility  Bed Mobility               General bed mobility comments: Pt was received sitting up EOB. Verbally reviewed log roll technique.    Transfers Overall transfer level: Needs assistance Equipment used: Rolling walker (2 wheels) Transfers: Sit to/from Stand Sit to Stand: Min assist           General transfer comment: Light assist for balance support and safety. VC's for hand placement on seated surface for safety. Initially with walking stick and pt with increased time and effort to power up; poor maintenance of precautions. With RW at end of session, pt with improved posture with stand>sit.    Ambulation/Gait Ambulation/Gait assistance: Contact guard assist Gait Distance (Feet): 200 Feet Assistive device: Rolling walker (2 wheels) Gait Pattern/deviations: Step-through pattern, Decreased stride length, Trunk flexed Gait velocity: Decreased Gait velocity interpretation: 1.31 - 2.62 ft/sec, indicative of limited community ambulator   General Gait Details: Initially with walking stick but with improved posture and fluidity of movement with RW instead.  Stairs Stairs: Yes Stairs assistance: Contact guard assist Stair Management: Two rails, Step to pattern, Forwards Number of Stairs: 4 General stair comments: VC's for sequencing and general safety.  Wheelchair Mobility     Tilt Bed    Modified Rankin (Stroke Patients Only)       Balance Overall balance assessment: Needs assistance Sitting-balance support: Feet supported Sitting balance-Leahy Scale: Good     Standing balance support: Single extremity supported, During functional activity Standing balance-Leahy Scale: Poor  Pertinent Vitals/Pain Pain Assessment Pain Assessment: Faces Faces Pain Scale: Hurts little more Pain Location: back Pain Descriptors / Indicators: Discomfort Pain Intervention(s):  Monitored during session, Limited activity within patient's tolerance, Repositioned    Home Living Family/patient expects to be discharged to:: Private residence Living Arrangements: Spouse/significant other Available Help at Discharge: Family Type of Home: House Home Access: Stairs to enter Entrance Stairs-Rails: Can reach both Entrance Stairs-Number of Steps: 4   Home Layout: One level Home Equipment: Agricultural consultant (2 wheels);Rollator (4 wheels);Cane - single point;Cane - quad      Prior Function Prior Level of Function : Independent/Modified Independent             Mobility Comments: reports several falls, most occur in teh yard. Ambulates with a walking stick intermittently ADLs Comments: indep, reports doing yard work but has a lot of falls when working in the yard     Extremity/Trunk Assessment   Upper Extremity Assessment Upper Extremity Assessment: Generalized weakness    Lower Extremity Assessment Lower Extremity Assessment: LLE deficits/detail LLE Deficits / Details: L foot drop; baseline and AFO present    Cervical / Trunk Assessment Cervical / Trunk Assessment: Back Surgery  Communication   Communication Communication: No apparent difficulties  Cognition Arousal: Alert Behavior During Therapy: WFL for tasks assessed/performed Overall Cognitive Status: Within Functional Limits for tasks assessed                                 General Comments: limited insight to safety awareness        General Comments General comments (skin integrity, edema, etc.): VSS, wife present. dressing clean and dry    Exercises     Assessment/Plan    PT Assessment Patient needs continued PT services  PT Problem List Decreased strength;Decreased activity tolerance;Decreased balance;Decreased mobility;Decreased knowledge of precautions;Decreased safety awareness;Decreased knowledge of use of DME;Pain       PT Treatment Interventions DME instruction;Gait  training;Stair training;Functional mobility training;Therapeutic activities;Therapeutic exercise;Balance training;Patient/family education    PT Goals (Current goals can be found in the Care Plan section)  Acute Rehab PT Goals Patient Stated Goal: Home today PT Goal Formulation: All assessment and education complete, DC therapy Time For Goal Achievement: 04/14/23 Potential to Achieve Goals: Good    Frequency Min 1X/week     Co-evaluation               AM-PAC PT "6 Clicks" Mobility  Outcome Measure Help needed turning from your back to your side while in a flat bed without using bedrails?: A Little Help needed moving from lying on your back to sitting on the side of a flat bed without using bedrails?: A Little Help needed moving to and from a bed to a chair (including a wheelchair)?: A Little Help needed standing up from a chair using your arms (e.g., wheelchair or bedside chair)?: A Little Help needed to walk in hospital room?: A Little Help needed climbing 3-5 steps with a railing? : A Little 6 Click Score: 18    End of Session Equipment Utilized During Treatment: Gait belt;Back brace Activity Tolerance: Patient tolerated treatment well Patient left: with call bell/phone within reach;with family/visitor present (Sitting EOB) Nurse Communication: Mobility status PT Visit Diagnosis: Unsteadiness on feet (R26.81);Pain Pain - part of body:  (back)    Time: 8657-8469 PT Time Calculation (min) (ACUTE ONLY): 17 min   Charges:   PT Evaluation $PT Eval Low  Complexity: 1 Low   PT General Charges $$ ACUTE PT VISIT: 1 Visit         Conni Slipper, PT, DPT Acute Rehabilitation Services Secure Chat Preferred Office: [phone]   Marylynn Pearson 04/07/2023, 12:23 PM

## 2023-05-06 ENCOUNTER — Other Ambulatory Visit: Payer: Self-pay | Admitting: Neurological Surgery

## 2023-05-10 ENCOUNTER — Other Ambulatory Visit: Payer: Self-pay | Admitting: Neurological Surgery

## 2023-05-12 ENCOUNTER — Encounter (HOSPITAL_COMMUNITY): Payer: Self-pay | Admitting: Neurological Surgery

## 2023-05-12 ENCOUNTER — Other Ambulatory Visit: Payer: Self-pay

## 2023-05-12 NOTE — Progress Notes (Signed)
SDW CALL  Patient was given pre-op instructions over the phone. The opportunity was given for the patient to ask questions. No further questions asked. Patient verbalized understanding of instructions given.   PCP - Gaye Alken NP Cardiologist - denies  PPM/ICD - denies Device Orders -  Rep Notified -   Chest x-ray -  EKG - 03/30/23 Stress Test - 10-15 years ago per pt, at [facility], normal per pt  ECHO - denies Cardiac Cath - denies  Sleep Study - denies CPAP - no  Blood Thinner Instructions:na Aspirin Instructions:na  ERAS Protcol -no PRE-SURGERY Ensure or G2-   COVID TEST- na   Anesthesia review: no  Patient denies shortness of breath, fever, cough and chest pain over the phone call   Surgical Instructions   Your procedure is scheduled on September 12  Report to [facility] Main Entrance "A" at 0800 A.M., then check in with the Admitting office.  Call this number if you have problems the morning of surgery:  [phone]    Remember:  Do not eat or drink anything after midnight the night before your surgery   Take these medicines the morning of surgery with A SIP OF WATER:     As of today, STOP taking any Aspirin (unless otherwise instructed by your surgeon) Aleve, Naproxen, Ibuprofen, Motrin, Advil, Goody's, BC's, all herbal medications, fish oil, and all vitamins.  [facility] is not responsible for any belongings or valuables. .   Do NOT Smoke (Tobacco/Vaping)  24 hours prior to your procedure  If you use a CPAP at night, you may bring your mask for your overnight stay.   Contacts, glasses, hearing aids, dentures or partials may not be worn into surgery, please bring cases for these belongings   Patients discharged the day of surgery will not be allowed to drive home, and someone needs to stay with them for 24 hours.   Special instructions:    Oral Hygiene is also important to reduce your risk of infection.  Remember - BRUSH YOUR TEETH THE  MORNING OF SURGERY WITH YOUR REGULAR TOOTHPASTE   Day of Surgery:  Take a shower the day of or night before with antibacterial soap. Wear Clean/Comfortable clothing the morning of surgery Do not apply any deodorants/lotions.   Do not wear jewelry or makeup Do not wear lotions, powders, perfumes/colognes, or deodorant. Do not shave 48 hours prior to surgery.  Men may shave face and neck. Do not bring valuables to the hospital. Do not wear nail polish, gel polish, artificial nails, or any other type of covering on natural nails (fingers and toes) If you have artificial nails or gel coating that need to be removed by a nail salon, please have this removed prior to surgery. Artificial nails or gel coating may interfere with anesthesia's ability to adequately monitor your vital signs. Remember to brush your teeth WITH YOUR REGULAR TOOTHPASTE.

## 2023-05-13 ENCOUNTER — Encounter (HOSPITAL_COMMUNITY): Payer: Self-pay | Admitting: Neurological Surgery

## 2023-05-13 ENCOUNTER — Inpatient Hospital Stay (HOSPITAL_COMMUNITY): Payer: PPO | Admitting: Anesthesiology

## 2023-05-13 ENCOUNTER — Encounter (HOSPITAL_COMMUNITY)
Admission: RE | Disposition: A | Discharge status: Home / Self Care | Payer: Self-pay | Source: Home / Self Care | Attending: Neurological Surgery

## 2023-05-13 ENCOUNTER — Ambulatory Visit (HOSPITAL_COMMUNITY)
Admission: RE | Admit: 2023-05-13 | Discharge: 2023-05-13 | Disposition: A | Discharge status: Home / Self Care | Payer: PPO | Attending: Neurological Surgery | Admitting: Neurological Surgery

## 2023-05-13 DIAGNOSIS — Z539 Procedure and treatment not carried out, unspecified reason: Secondary | ICD-10-CM | POA: Insufficient documentation

## 2023-05-13 DIAGNOSIS — M48062 Spinal stenosis, lumbar region with neurogenic claudication: Secondary | ICD-10-CM | POA: Diagnosis not present

## 2023-05-13 LAB — CBC
HCT: 40.2 % (ref 39.0–52.0)
Hemoglobin: 13.5 g/dL (ref 13.0–17.0)
MCH: 30.1 pg (ref 26.0–34.0)
MCHC: 33.6 g/dL (ref 30.0–36.0)
MCV: 89.7 fL (ref 80.0–100.0)
Platelets: 199 10*3/uL (ref 150–400)
RBC: 4.48 MIL/uL (ref 4.22–5.81)
RDW: 12.2 % (ref 11.5–15.5)
WBC: 5.1 10*3/uL (ref 4.0–10.5)
nRBC: 0 % (ref 0.0–0.2)

## 2023-05-13 LAB — SURGICAL PCR SCREEN
MRSA, PCR: NEGATIVE
Staphylococcus aureus: NEGATIVE

## 2023-05-13 LAB — BASIC METABOLIC PANEL
Anion gap: 9 (ref 5–15)
BUN: 7 mg/dL — ABNORMAL LOW (ref 8–23)
CO2: 24 mmol/L (ref 22–32)
Calcium: 8.3 mg/dL — ABNORMAL LOW (ref 8.9–10.3)
Chloride: 104 mmol/L (ref 98–111)
Creatinine, Ser: 0.81 mg/dL (ref 0.61–1.24)
GFR, Estimated: >60 mL/min (ref >60)
Glucose, Bld: 89 mg/dL (ref 70–99)
Potassium: 3.1 mmol/L — ABNORMAL LOW (ref 3.5–5.1)
Sodium: 137 mmol/L (ref 135–145)

## 2023-05-13 SURGERY — LUMBAR WOUND DEBRIDEMENT
Anesthesia: General

## 2023-05-13 MED ORDER — CHLORHEXIDINE GLUCONATE 0.12 % MT SOLN
15.0000 mL | OROMUCOSAL | Status: AC
  Administered 2023-05-13: 15 mL via OROMUCOSAL
  Filled 2023-05-13: qty 15

## 2023-05-13 MED ORDER — PROPOFOL 10 MG/ML IV BOLUS
INTRAVENOUS | Status: AC
  Filled 2023-05-13: qty 20

## 2023-05-13 MED ORDER — ROCURONIUM BROMIDE 10 MG/ML (PF) SYRINGE
PREFILLED_SYRINGE | INTRAVENOUS | Status: AC
  Filled 2023-05-13: qty 10

## 2023-05-13 MED ORDER — FENTANYL CITRATE (PF) 250 MCG/5ML IJ SOLN
INTRAMUSCULAR | Status: AC
  Filled 2023-05-13: qty 5

## 2023-05-13 MED ORDER — ACETAMINOPHEN 500 MG PO TABS
ORAL_TABLET | ORAL | Status: AC
  Administered 2023-05-13: 1000 mg via ORAL
  Filled 2023-05-13: qty 2

## 2023-05-13 MED ORDER — VANCOMYCIN HCL IN DEXTROSE 1-5 GM/200ML-% IV SOLN
1000.0000 mg | INTRAVENOUS | Status: AC
  Administered 2023-05-13: 1000 mg via INTRAVENOUS
  Filled 2023-05-13: qty 200

## 2023-05-13 MED ORDER — LIDOCAINE 2% (20 MG/ML) 5 ML SYRINGE
INTRAMUSCULAR | Status: AC
  Filled 2023-05-13: qty 5

## 2023-05-13 MED ORDER — MIDAZOLAM HCL 2 MG/2ML IJ SOLN
INTRAMUSCULAR | Status: AC
  Filled 2023-05-13: qty 2

## 2023-05-13 MED ORDER — CHLORHEXIDINE GLUCONATE CLOTH 2 % EX PADS: 6.0000 | MEDICATED_PAD | Freq: Once | CUTANEOUS | Status: DC

## 2023-05-13 MED ORDER — LACTATED RINGERS IV SOLN: INTRAVENOUS | Status: DC

## 2023-05-13 MED ORDER — ACETAMINOPHEN 500 MG PO TABS: 1000.0000 mg | ORAL_TABLET | ORAL | Status: AC

## 2023-05-13 NOTE — Progress Notes (Signed)
This is a pleasant 72 year old male, status post L2-4 open laminectomy in August.  During his postoperative visit his lumbar wound was quite swollen but no signs of erythema or purulence.  There was quite a bit of fluctuance and therefore I was concerned for either a complicated seroma versus spontaneous CSF leak, as there was no CSF leak intraoperatively identified.  Therefore I had scheduled him for lumbar wound exploration, however today upon evaluation his wound has significantly improved.  The swelling is significantly decreased.  It remains well-healed along the skin, there is mild fluctuance compared to prior.  Therefore I discussed with the patient extensively about continuing to monitor him closely rather than performing a lumbar wound exploration today.  He verbalizes understanding and agreed with our plan.  I have called my office and set up a follow-up appointment for 1 week.

## 2023-05-13 NOTE — Progress Notes (Signed)
MD at bedside cancelling surgery for today. See note for details. Patient dc'd to home with brother.

## 2023-05-13 NOTE — Anesthesia Preprocedure Evaluation (Signed)
Anesthesia Evaluation  Patient identified by MRN, date of birth, ID band Patient awake    Reviewed: Allergy & Precautions, NPO status , Patient's Chart, lab work & pertinent test results  History of Anesthesia Complications Negative for: history of anesthetic complications  Airway Mallampati: II  TM Distance: >3 FB Neck ROM: Full    Dental  (+) Edentulous Lower, Edentulous Upper   Pulmonary former smoker Patient denies lung issues   Pulmonary exam normal breath sounds clear to auscultation       Cardiovascular hypertension, Pt. on medications Normal cardiovascular exam Rhythm:Regular Rate:Normal     Neuro/Psych  Headaches PSYCHIATRIC DISORDERS  Depression    Chronic lumbar pain: spinal cord stim  Neuromuscular disease    GI/Hepatic Neg liver ROS,GERD  Medicated and Controlled,,  Endo/Other  negative endocrine ROS    Renal/GU negative Renal ROSH/o stones     Musculoskeletal  (+) Arthritis ,  Ambulates with a cane   Abdominal   Peds  Hematology negative hematology ROS (+)   Anesthesia Other Findings LUMBAR STENOSIS WITH NEUROGENIC CLAUDICATION  Reproductive/Obstetrics                             Anesthesia Physical Anesthesia Plan  ASA: 3  Anesthesia Plan: General   Post-op Pain Management: Tylenol PO (pre-op)*   Induction: Intravenous  PONV Risk Score and Plan: 2 and Ondansetron, Dexamethasone and Treatment may vary due to age or medical condition  Airway Management Planned: Oral ETT  Additional Equipment: None  Intra-op Plan:   Post-operative Plan: Extubation in OR  Informed Consent: I have reviewed the patients History and Physical, chart, labs and discussed the procedure including the risks, benefits and alternatives for the proposed anesthesia with the patient or authorized representative who has indicated his/her understanding and acceptance.     Dental advisory  given  Plan Discussed with: CRNA and Anesthesiologist  Anesthesia Plan Comments:         Anesthesia Quick Evaluation

## 2023-06-24 DIAGNOSIS — Z6827 Body mass index (BMI) 27.0-27.9, adult: Secondary | ICD-10-CM | POA: Diagnosis not present

## 2023-06-24 DIAGNOSIS — I10 Essential (primary) hypertension: Secondary | ICD-10-CM | POA: Diagnosis not present

## 2023-06-24 DIAGNOSIS — F411 Generalized anxiety disorder: Secondary | ICD-10-CM | POA: Diagnosis not present

## 2023-06-28 DIAGNOSIS — Z9181 History of falling: Secondary | ICD-10-CM | POA: Diagnosis not present

## 2023-06-28 DIAGNOSIS — Z0000 Encounter for general adult medical examination without abnormal findings: Secondary | ICD-10-CM | POA: Diagnosis not present

## 2023-07-06 DIAGNOSIS — L820 Inflamed seborrheic keratosis: Secondary | ICD-10-CM | POA: Diagnosis not present

## 2023-07-13 DIAGNOSIS — I10 Essential (primary) hypertension: Secondary | ICD-10-CM | POA: Diagnosis not present

## 2023-07-22 DIAGNOSIS — R9431 Abnormal electrocardiogram [ECG] [EKG]: Secondary | ICD-10-CM | POA: Diagnosis not present

## 2023-07-22 DIAGNOSIS — R4781 Slurred speech: Secondary | ICD-10-CM | POA: Diagnosis not present

## 2023-07-22 DIAGNOSIS — Z79899 Other long term (current) drug therapy: Secondary | ICD-10-CM | POA: Diagnosis not present

## 2023-07-22 DIAGNOSIS — M21372 Foot drop, left foot: Secondary | ICD-10-CM | POA: Diagnosis not present

## 2023-07-22 DIAGNOSIS — I160 Hypertensive urgency: Secondary | ICD-10-CM | POA: Diagnosis not present

## 2023-07-22 DIAGNOSIS — I10 Essential (primary) hypertension: Secondary | ICD-10-CM | POA: Diagnosis not present

## 2023-07-22 DIAGNOSIS — I441 Atrioventricular block, second degree: Secondary | ICD-10-CM | POA: Diagnosis not present

## 2023-07-22 DIAGNOSIS — R519 Headache, unspecified: Secondary | ICD-10-CM | POA: Diagnosis not present

## 2023-07-22 DIAGNOSIS — G629 Polyneuropathy, unspecified: Secondary | ICD-10-CM | POA: Diagnosis not present

## 2023-07-22 DIAGNOSIS — E785 Hyperlipidemia, unspecified: Secondary | ICD-10-CM | POA: Diagnosis not present

## 2023-07-22 DIAGNOSIS — R200 Anesthesia of skin: Secondary | ICD-10-CM | POA: Diagnosis not present

## 2023-07-22 DIAGNOSIS — R2990 Unspecified symptoms and signs involving the nervous system: Secondary | ICD-10-CM | POA: Diagnosis not present

## 2023-07-22 DIAGNOSIS — Z87442 Personal history of urinary calculi: Secondary | ICD-10-CM | POA: Diagnosis not present

## 2023-07-22 DIAGNOSIS — R410 Disorientation, unspecified: Secondary | ICD-10-CM | POA: Diagnosis not present

## 2023-07-22 DIAGNOSIS — R42 Dizziness and giddiness: Secondary | ICD-10-CM | POA: Diagnosis not present

## 2023-07-22 DIAGNOSIS — F418 Other specified anxiety disorders: Secondary | ICD-10-CM | POA: Diagnosis not present

## 2023-07-22 DIAGNOSIS — Z23 Encounter for immunization: Secondary | ICD-10-CM | POA: Diagnosis not present

## 2023-07-22 DIAGNOSIS — R2681 Unsteadiness on feet: Secondary | ICD-10-CM | POA: Diagnosis not present

## 2023-07-23 DIAGNOSIS — I10 Essential (primary) hypertension: Secondary | ICD-10-CM | POA: Diagnosis not present

## 2023-07-23 DIAGNOSIS — I441 Atrioventricular block, second degree: Secondary | ICD-10-CM | POA: Diagnosis not present

## 2023-07-23 DIAGNOSIS — R5383 Other fatigue: Secondary | ICD-10-CM | POA: Diagnosis not present

## 2023-07-23 DIAGNOSIS — G629 Polyneuropathy, unspecified: Secondary | ICD-10-CM | POA: Diagnosis not present

## 2023-07-23 DIAGNOSIS — I6389 Other cerebral infarction: Secondary | ICD-10-CM | POA: Diagnosis not present

## 2023-07-23 DIAGNOSIS — I639 Cerebral infarction, unspecified: Secondary | ICD-10-CM | POA: Diagnosis not present

## 2023-07-23 DIAGNOSIS — R9431 Abnormal electrocardiogram [ECG] [EKG]: Secondary | ICD-10-CM | POA: Diagnosis not present

## 2023-07-23 DIAGNOSIS — R2990 Unspecified symptoms and signs involving the nervous system: Secondary | ICD-10-CM | POA: Diagnosis not present

## 2023-07-23 DIAGNOSIS — R42 Dizziness and giddiness: Secondary | ICD-10-CM | POA: Diagnosis not present

## 2023-07-23 DIAGNOSIS — M21379 Foot drop, unspecified foot: Secondary | ICD-10-CM | POA: Diagnosis not present

## 2023-07-23 DIAGNOSIS — I160 Hypertensive urgency: Secondary | ICD-10-CM | POA: Diagnosis not present

## 2023-08-02 DIAGNOSIS — Z87442 Personal history of urinary calculi: Secondary | ICD-10-CM | POA: Diagnosis not present

## 2023-08-02 DIAGNOSIS — K589 Irritable bowel syndrome without diarrhea: Secondary | ICD-10-CM | POA: Diagnosis not present

## 2023-08-02 DIAGNOSIS — R4781 Slurred speech: Secondary | ICD-10-CM | POA: Diagnosis not present

## 2023-08-02 DIAGNOSIS — I340 Nonrheumatic mitral (valve) insufficiency: Secondary | ICD-10-CM | POA: Diagnosis not present

## 2023-08-02 DIAGNOSIS — M21372 Foot drop, left foot: Secondary | ICD-10-CM | POA: Diagnosis not present

## 2023-08-02 DIAGNOSIS — Z9682 Presence of neurostimulator: Secondary | ICD-10-CM | POA: Diagnosis not present

## 2023-08-02 DIAGNOSIS — G629 Polyneuropathy, unspecified: Secondary | ICD-10-CM | POA: Diagnosis not present

## 2023-08-02 DIAGNOSIS — I160 Hypertensive urgency: Secondary | ICD-10-CM | POA: Diagnosis not present

## 2023-08-02 DIAGNOSIS — Z79899 Other long term (current) drug therapy: Secondary | ICD-10-CM | POA: Diagnosis not present

## 2023-08-02 DIAGNOSIS — F32A Depression, unspecified: Secondary | ICD-10-CM | POA: Diagnosis not present

## 2023-08-02 DIAGNOSIS — F411 Generalized anxiety disorder: Secondary | ICD-10-CM | POA: Diagnosis not present

## 2023-08-02 DIAGNOSIS — Z87891 Personal history of nicotine dependence: Secondary | ICD-10-CM | POA: Diagnosis not present

## 2023-08-02 DIAGNOSIS — F5104 Psychophysiologic insomnia: Secondary | ICD-10-CM | POA: Diagnosis not present

## 2023-08-02 DIAGNOSIS — E785 Hyperlipidemia, unspecified: Secondary | ICD-10-CM | POA: Diagnosis not present

## 2023-08-02 DIAGNOSIS — M1990 Unspecified osteoarthritis, unspecified site: Secondary | ICD-10-CM | POA: Diagnosis not present

## 2023-08-02 DIAGNOSIS — K2270 Barrett's esophagus without dysplasia: Secondary | ICD-10-CM | POA: Diagnosis not present

## 2023-08-02 DIAGNOSIS — Z556 Problems related to health literacy: Secondary | ICD-10-CM | POA: Diagnosis not present

## 2023-08-02 DIAGNOSIS — I119 Hypertensive heart disease without heart failure: Secondary | ICD-10-CM | POA: Diagnosis not present

## 2023-08-02 DIAGNOSIS — K219 Gastro-esophageal reflux disease without esophagitis: Secondary | ICD-10-CM | POA: Diagnosis not present

## 2023-08-02 DIAGNOSIS — M5116 Intervertebral disc disorders with radiculopathy, lumbar region: Secondary | ICD-10-CM | POA: Diagnosis not present

## 2023-08-03 DIAGNOSIS — R2990 Unspecified symptoms and signs involving the nervous system: Secondary | ICD-10-CM | POA: Diagnosis not present

## 2023-08-03 DIAGNOSIS — F411 Generalized anxiety disorder: Secondary | ICD-10-CM | POA: Diagnosis not present

## 2023-08-03 DIAGNOSIS — R262 Difficulty in walking, not elsewhere classified: Secondary | ICD-10-CM | POA: Diagnosis not present

## 2023-08-03 DIAGNOSIS — M5416 Radiculopathy, lumbar region: Secondary | ICD-10-CM | POA: Diagnosis not present

## 2023-08-03 DIAGNOSIS — Z79899 Other long term (current) drug therapy: Secondary | ICD-10-CM | POA: Diagnosis not present

## 2023-08-03 DIAGNOSIS — M21372 Foot drop, left foot: Secondary | ICD-10-CM | POA: Diagnosis not present

## 2023-10-01 DIAGNOSIS — F5104 Psychophysiologic insomnia: Secondary | ICD-10-CM | POA: Diagnosis not present

## 2023-10-01 DIAGNOSIS — Z9181 History of falling: Secondary | ICD-10-CM | POA: Diagnosis not present

## 2023-10-01 DIAGNOSIS — M542 Cervicalgia: Secondary | ICD-10-CM | POA: Diagnosis not present

## 2023-10-01 DIAGNOSIS — M5481 Occipital neuralgia: Secondary | ICD-10-CM | POA: Diagnosis not present

## 2023-10-01 DIAGNOSIS — J309 Allergic rhinitis, unspecified: Secondary | ICD-10-CM | POA: Diagnosis not present

## 2023-10-01 DIAGNOSIS — K2270 Barrett's esophagus without dysplasia: Secondary | ICD-10-CM | POA: Diagnosis not present

## 2023-10-01 DIAGNOSIS — Z79899 Other long term (current) drug therapy: Secondary | ICD-10-CM | POA: Diagnosis not present

## 2023-10-01 DIAGNOSIS — F411 Generalized anxiety disorder: Secondary | ICD-10-CM | POA: Diagnosis not present

## 2023-10-01 DIAGNOSIS — R262 Difficulty in walking, not elsewhere classified: Secondary | ICD-10-CM | POA: Diagnosis not present

## 2023-10-01 DIAGNOSIS — E538 Deficiency of other specified B group vitamins: Secondary | ICD-10-CM | POA: Diagnosis not present

## 2023-10-01 DIAGNOSIS — I10 Essential (primary) hypertension: Secondary | ICD-10-CM | POA: Diagnosis not present

## 2023-10-01 DIAGNOSIS — R7309 Other abnormal glucose: Secondary | ICD-10-CM | POA: Diagnosis not present

## 2023-10-01 DIAGNOSIS — E785 Hyperlipidemia, unspecified: Secondary | ICD-10-CM | POA: Diagnosis not present

## 2023-10-13 DIAGNOSIS — Z6827 Body mass index (BMI) 27.0-27.9, adult: Secondary | ICD-10-CM | POA: Diagnosis not present

## 2023-10-13 DIAGNOSIS — M48062 Spinal stenosis, lumbar region with neurogenic claudication: Secondary | ICD-10-CM | POA: Diagnosis not present

## 2023-10-13 DIAGNOSIS — M542 Cervicalgia: Secondary | ICD-10-CM | POA: Diagnosis not present

## 2023-11-01 DIAGNOSIS — I10 Essential (primary) hypertension: Secondary | ICD-10-CM | POA: Diagnosis not present

## 2023-11-01 DIAGNOSIS — E538 Deficiency of other specified B group vitamins: Secondary | ICD-10-CM | POA: Diagnosis not present

## 2023-12-02 DIAGNOSIS — E538 Deficiency of other specified B group vitamins: Secondary | ICD-10-CM | POA: Diagnosis not present

## 2023-12-28 DIAGNOSIS — R609 Edema, unspecified: Secondary | ICD-10-CM | POA: Diagnosis not present

## 2023-12-28 DIAGNOSIS — I10 Essential (primary) hypertension: Secondary | ICD-10-CM | POA: Diagnosis not present

## 2024-01-04 DIAGNOSIS — E538 Deficiency of other specified B group vitamins: Secondary | ICD-10-CM | POA: Diagnosis not present

## 2024-01-11 DIAGNOSIS — M25562 Pain in left knee: Secondary | ICD-10-CM | POA: Diagnosis not present

## 2024-01-11 DIAGNOSIS — M25561 Pain in right knee: Secondary | ICD-10-CM | POA: Diagnosis not present

## 2024-02-04 DIAGNOSIS — I10 Essential (primary) hypertension: Secondary | ICD-10-CM | POA: Diagnosis not present

## 2024-02-04 DIAGNOSIS — R609 Edema, unspecified: Secondary | ICD-10-CM | POA: Diagnosis not present

## 2024-02-13 DIAGNOSIS — I10 Essential (primary) hypertension: Secondary | ICD-10-CM | POA: Diagnosis not present

## 2024-02-13 DIAGNOSIS — M1990 Unspecified osteoarthritis, unspecified site: Secondary | ICD-10-CM | POA: Diagnosis not present

## 2024-02-13 DIAGNOSIS — E162 Hypoglycemia, unspecified: Secondary | ICD-10-CM | POA: Diagnosis not present

## 2024-02-13 DIAGNOSIS — K219 Gastro-esophageal reflux disease without esophagitis: Secondary | ICD-10-CM | POA: Diagnosis not present

## 2024-02-13 DIAGNOSIS — I639 Cerebral infarction, unspecified: Secondary | ICD-10-CM | POA: Diagnosis not present

## 2024-02-13 DIAGNOSIS — G459 Transient cerebral ischemic attack, unspecified: Secondary | ICD-10-CM | POA: Diagnosis not present

## 2024-02-13 DIAGNOSIS — R471 Dysarthria and anarthria: Secondary | ICD-10-CM | POA: Diagnosis not present

## 2024-02-13 DIAGNOSIS — R29818 Other symptoms and signs involving the nervous system: Secondary | ICD-10-CM | POA: Diagnosis not present

## 2024-02-13 DIAGNOSIS — R410 Disorientation, unspecified: Secondary | ICD-10-CM | POA: Diagnosis not present

## 2024-02-13 DIAGNOSIS — Z87891 Personal history of nicotine dependence: Secondary | ICD-10-CM | POA: Diagnosis not present

## 2024-02-13 DIAGNOSIS — Z7982 Long term (current) use of aspirin: Secondary | ICD-10-CM | POA: Diagnosis not present

## 2024-02-13 DIAGNOSIS — F419 Anxiety disorder, unspecified: Secondary | ICD-10-CM | POA: Diagnosis not present

## 2024-02-13 DIAGNOSIS — Z881 Allergy status to other antibiotic agents status: Secondary | ICD-10-CM | POA: Diagnosis not present

## 2024-02-13 DIAGNOSIS — E7800 Pure hypercholesterolemia, unspecified: Secondary | ICD-10-CM | POA: Diagnosis not present

## 2024-02-13 DIAGNOSIS — I160 Hypertensive urgency: Secondary | ICD-10-CM | POA: Diagnosis not present

## 2024-02-13 DIAGNOSIS — G929 Unspecified toxic encephalopathy: Secondary | ICD-10-CM | POA: Diagnosis not present

## 2024-02-13 DIAGNOSIS — R4182 Altered mental status, unspecified: Secondary | ICD-10-CM | POA: Diagnosis not present

## 2024-02-13 DIAGNOSIS — F418 Other specified anxiety disorders: Secondary | ICD-10-CM | POA: Diagnosis not present

## 2024-02-13 DIAGNOSIS — Z8673 Personal history of transient ischemic attack (TIA), and cerebral infarction without residual deficits: Secondary | ICD-10-CM | POA: Diagnosis not present

## 2024-02-13 DIAGNOSIS — G4700 Insomnia, unspecified: Secondary | ICD-10-CM | POA: Diagnosis not present

## 2024-02-13 DIAGNOSIS — I499 Cardiac arrhythmia, unspecified: Secondary | ICD-10-CM | POA: Diagnosis not present

## 2024-02-13 DIAGNOSIS — Z7902 Long term (current) use of antithrombotics/antiplatelets: Secondary | ICD-10-CM | POA: Diagnosis not present

## 2024-02-13 DIAGNOSIS — M21379 Foot drop, unspecified foot: Secondary | ICD-10-CM | POA: Diagnosis not present

## 2024-02-13 DIAGNOSIS — R2990 Unspecified symptoms and signs involving the nervous system: Secondary | ICD-10-CM | POA: Diagnosis not present

## 2024-02-13 DIAGNOSIS — R9431 Abnormal electrocardiogram [ECG] [EKG]: Secondary | ICD-10-CM | POA: Diagnosis not present

## 2024-02-13 DIAGNOSIS — Z79899 Other long term (current) drug therapy: Secondary | ICD-10-CM | POA: Diagnosis not present

## 2024-02-13 DIAGNOSIS — I444 Left anterior fascicular block: Secondary | ICD-10-CM | POA: Diagnosis not present

## 2024-02-13 DIAGNOSIS — R29810 Facial weakness: Secondary | ICD-10-CM | POA: Diagnosis not present

## 2024-02-14 DIAGNOSIS — R471 Dysarthria and anarthria: Secondary | ICD-10-CM | POA: Diagnosis not present

## 2024-02-14 DIAGNOSIS — I639 Cerebral infarction, unspecified: Secondary | ICD-10-CM | POA: Diagnosis not present

## 2024-02-14 DIAGNOSIS — I160 Hypertensive urgency: Secondary | ICD-10-CM | POA: Diagnosis not present

## 2024-02-14 DIAGNOSIS — R2990 Unspecified symptoms and signs involving the nervous system: Secondary | ICD-10-CM | POA: Diagnosis not present

## 2024-02-17 DIAGNOSIS — I10 Essential (primary) hypertension: Secondary | ICD-10-CM | POA: Diagnosis not present

## 2024-02-17 DIAGNOSIS — Z7982 Long term (current) use of aspirin: Secondary | ICD-10-CM | POA: Diagnosis not present

## 2024-02-17 DIAGNOSIS — G629 Polyneuropathy, unspecified: Secondary | ICD-10-CM | POA: Diagnosis not present

## 2024-02-17 DIAGNOSIS — R2990 Unspecified symptoms and signs involving the nervous system: Secondary | ICD-10-CM | POA: Diagnosis not present

## 2024-02-17 DIAGNOSIS — G4459 Other complicated headache syndrome: Secondary | ICD-10-CM | POA: Diagnosis not present

## 2024-02-17 DIAGNOSIS — E7800 Pure hypercholesterolemia, unspecified: Secondary | ICD-10-CM | POA: Diagnosis not present

## 2024-02-17 DIAGNOSIS — Z87891 Personal history of nicotine dependence: Secondary | ICD-10-CM | POA: Diagnosis not present

## 2024-02-17 DIAGNOSIS — F419 Anxiety disorder, unspecified: Secondary | ICD-10-CM | POA: Diagnosis not present

## 2024-02-17 DIAGNOSIS — F32A Depression, unspecified: Secondary | ICD-10-CM | POA: Diagnosis not present

## 2024-02-17 DIAGNOSIS — R29818 Other symptoms and signs involving the nervous system: Secondary | ICD-10-CM | POA: Diagnosis not present

## 2024-02-17 DIAGNOSIS — K219 Gastro-esophageal reflux disease without esophagitis: Secondary | ICD-10-CM | POA: Diagnosis not present

## 2024-02-17 DIAGNOSIS — Z79899 Other long term (current) drug therapy: Secondary | ICD-10-CM | POA: Diagnosis not present

## 2024-02-17 DIAGNOSIS — Z8673 Personal history of transient ischemic attack (TIA), and cerebral infarction without residual deficits: Secondary | ICD-10-CM | POA: Diagnosis not present

## 2024-02-17 DIAGNOSIS — Z7902 Long term (current) use of antithrombotics/antiplatelets: Secondary | ICD-10-CM | POA: Diagnosis not present

## 2024-02-17 DIAGNOSIS — I444 Left anterior fascicular block: Secondary | ICD-10-CM | POA: Diagnosis not present

## 2024-02-17 DIAGNOSIS — Z880 Allergy status to penicillin: Secondary | ICD-10-CM | POA: Diagnosis not present

## 2024-02-18 DIAGNOSIS — R2990 Unspecified symptoms and signs involving the nervous system: Secondary | ICD-10-CM | POA: Diagnosis not present

## 2024-02-18 DIAGNOSIS — I10 Essential (primary) hypertension: Secondary | ICD-10-CM | POA: Diagnosis not present

## 2024-02-18 DIAGNOSIS — G4459 Other complicated headache syndrome: Secondary | ICD-10-CM | POA: Diagnosis not present

## 2024-02-18 DIAGNOSIS — R479 Unspecified speech disturbances: Secondary | ICD-10-CM | POA: Diagnosis not present

## 2024-02-18 DIAGNOSIS — R200 Anesthesia of skin: Secondary | ICD-10-CM | POA: Diagnosis not present

## 2024-03-09 DIAGNOSIS — K219 Gastro-esophageal reflux disease without esophagitis: Secondary | ICD-10-CM | POA: Diagnosis not present

## 2024-03-09 DIAGNOSIS — R2990 Unspecified symptoms and signs involving the nervous system: Secondary | ICD-10-CM | POA: Diagnosis not present

## 2024-03-09 DIAGNOSIS — I10 Essential (primary) hypertension: Secondary | ICD-10-CM | POA: Diagnosis not present

## 2024-03-09 DIAGNOSIS — Z6828 Body mass index (BMI) 28.0-28.9, adult: Secondary | ICD-10-CM | POA: Diagnosis not present

## 2024-03-09 DIAGNOSIS — R0602 Shortness of breath: Secondary | ICD-10-CM | POA: Diagnosis not present

## 2024-03-09 DIAGNOSIS — K2270 Barrett's esophagus without dysplasia: Secondary | ICD-10-CM | POA: Diagnosis not present

## 2024-03-09 DIAGNOSIS — Z79899 Other long term (current) drug therapy: Secondary | ICD-10-CM | POA: Diagnosis not present

## 2024-03-13 DIAGNOSIS — I10 Essential (primary) hypertension: Secondary | ICD-10-CM | POA: Diagnosis not present

## 2024-03-13 DIAGNOSIS — E538 Deficiency of other specified B group vitamins: Secondary | ICD-10-CM | POA: Diagnosis not present

## 2024-03-13 DIAGNOSIS — Z6828 Body mass index (BMI) 28.0-28.9, adult: Secondary | ICD-10-CM | POA: Diagnosis not present

## 2024-04-04 ENCOUNTER — Other Ambulatory Visit: Payer: Self-pay | Admitting: Surgery

## 2024-04-25 DIAGNOSIS — Z6829 Body mass index (BMI) 29.0-29.9, adult: Secondary | ICD-10-CM | POA: Diagnosis not present

## 2024-04-25 DIAGNOSIS — I10 Essential (primary) hypertension: Secondary | ICD-10-CM | POA: Diagnosis not present

## 2024-04-25 DIAGNOSIS — R609 Edema, unspecified: Secondary | ICD-10-CM | POA: Diagnosis not present

## 2024-04-25 DIAGNOSIS — H6060 Unspecified chronic otitis externa, unspecified ear: Secondary | ICD-10-CM | POA: Diagnosis not present

## 2024-04-25 DIAGNOSIS — E538 Deficiency of other specified B group vitamins: Secondary | ICD-10-CM | POA: Diagnosis not present

## 2024-06-05 DIAGNOSIS — F5104 Psychophysiologic insomnia: Secondary | ICD-10-CM | POA: Diagnosis not present

## 2024-06-05 DIAGNOSIS — E785 Hyperlipidemia, unspecified: Secondary | ICD-10-CM | POA: Diagnosis not present

## 2024-06-05 DIAGNOSIS — R609 Edema, unspecified: Secondary | ICD-10-CM | POA: Diagnosis not present

## 2024-06-05 DIAGNOSIS — M48062 Spinal stenosis, lumbar region with neurogenic claudication: Secondary | ICD-10-CM | POA: Diagnosis not present

## 2024-06-05 DIAGNOSIS — Z6828 Body mass index (BMI) 28.0-28.9, adult: Secondary | ICD-10-CM | POA: Diagnosis not present

## 2024-06-05 DIAGNOSIS — K2270 Barrett's esophagus without dysplasia: Secondary | ICD-10-CM | POA: Diagnosis not present

## 2024-06-05 DIAGNOSIS — E538 Deficiency of other specified B group vitamins: Secondary | ICD-10-CM | POA: Diagnosis not present

## 2024-06-05 DIAGNOSIS — F411 Generalized anxiety disorder: Secondary | ICD-10-CM | POA: Diagnosis not present

## 2024-06-05 DIAGNOSIS — I10 Essential (primary) hypertension: Secondary | ICD-10-CM | POA: Diagnosis not present
